# Patient Record
Sex: Female | Born: 1980 | Race: White | Hispanic: No | Smoking: Current some day smoker
Health system: Southern US, Community
[De-identification: ages and names within clinical notes are randomized; demographics above are authoritative.]

## PROBLEM LIST (undated history)

## (undated) DIAGNOSIS — J189 Pneumonia, unspecified organism: Secondary | ICD-10-CM

---

## 2017-07-02 ENCOUNTER — Encounter (HOSPITAL_COMMUNITY): Payer: Self-pay

## 2017-07-02 ENCOUNTER — Emergency Department (HOSPITAL_COMMUNITY): Payer: Self-pay

## 2017-07-02 ENCOUNTER — Emergency Department (HOSPITAL_COMMUNITY)
Admission: EM | Admit: 2017-07-02 | Discharge: 2017-07-02 | Disposition: A | Discharge status: Home / Self Care | Payer: Self-pay | Attending: Emergency Medicine | Admitting: Emergency Medicine

## 2017-07-02 ENCOUNTER — Other Ambulatory Visit: Payer: Self-pay

## 2017-07-02 DIAGNOSIS — X500XXA Overexertion from strenuous movement or load, initial encounter: Secondary | ICD-10-CM | POA: Insufficient documentation

## 2017-07-02 DIAGNOSIS — Z23 Encounter for immunization: Secondary | ICD-10-CM | POA: Insufficient documentation

## 2017-07-02 DIAGNOSIS — Y999 Unspecified external cause status: Secondary | ICD-10-CM | POA: Insufficient documentation

## 2017-07-02 DIAGNOSIS — Y9301 Activity, walking, marching and hiking: Secondary | ICD-10-CM | POA: Insufficient documentation

## 2017-07-02 DIAGNOSIS — Y929 Unspecified place or not applicable: Secondary | ICD-10-CM | POA: Insufficient documentation

## 2017-07-02 DIAGNOSIS — S82832A Other fracture of upper and lower end of left fibula, initial encounter for closed fracture: Secondary | ICD-10-CM | POA: Insufficient documentation

## 2017-07-02 MED ORDER — TETANUS-DIPHTH-ACELL PERTUSSIS 5-2.5-18.5 LF-MCG/0.5 IM SUSP
0.5000 mL | Freq: Once | INTRAMUSCULAR | Status: AC
  Administered 2017-07-02: 0.5 mL via INTRAMUSCULAR
  Filled 2017-07-02: qty 0.5

## 2017-07-02 MED ORDER — OXYCODONE-ACETAMINOPHEN 5-325 MG PO TABS: 1.0000 | ORAL_TABLET | Freq: Three times a day (TID) | ORAL | 0 refills | Status: DC | PRN

## 2017-07-02 MED ORDER — OXYCODONE-ACETAMINOPHEN 5-325 MG PO TABS
1.0000 | ORAL_TABLET | Freq: Once | ORAL | Status: AC
  Administered 2017-07-02: 1 via ORAL
  Filled 2017-07-02: qty 1

## 2017-07-02 NOTE — ED Triage Notes (Signed)
Pt reports that she rolled her foot  And fell while walking her dog today around 2pm. Pt reports 19/10 ankle and shin pain to left leg. Pt has swelling noted to left ankle and + PMS

## 2017-07-02 NOTE — Discharge Instructions (Addendum)
Please do not bear weight, use crutches to move about.  Call and follow up with orthopedist next week for further care.  Return if you have any concerns.

## 2017-07-02 NOTE — ED Provider Notes (Signed)
[facility]-EMERGENCY DEPT Provider Note   CSN: [id number] Arrival date & time: 07/02/17  1742     History   Chief Complaint No chief complaint on file.   HPI Connie Long is a 37 y.o. female.  HPI   37 year old female presenting for evaluation of left ankle injury.  Patient report approximately 3 hours ago she was walking her dog when she was distracted and stepped down on an uneven ledge wrong and twisted her left ankle.  She fell down the ground but denies hitting her head or loss of consciousness.  She report acute onset of sharp pain, nonradiating, involving her left ankle with pain mostly to the lateral aspect of her ankle.  She was unable to bear weight afterward.  No pain to her knee, hip, or foot.  She suffered a scrape, does not recall last tetanus status.  No numbness or weakness.  No precipitating symptoms prior to the fall.  No past medical history on file.  There are no active problems to display for this patient.   The histories are not reviewed yet. Please review them in the "History" navigator section and refresh this SmartLink.   OB History   None      Home Medications    Prior to Admission medications   Not on File    Family History No family history on file.  Social History Social History   Tobacco Use  . Smoking status: Not on file  Substance Use Topics  . Alcohol use: Not on file  . Drug use: Not on file     Allergies   Patient has no allergy information on record.   Review of Systems Review of Systems  Constitutional: Negative for fever.  Musculoskeletal: Positive for joint swelling.  Skin: Positive for wound.  Neurological: Negative for numbness.     Physical Exam Updated Vital Signs BP (!) 140/102 (BP Location: Right Arm)   Pulse 83   Temp 98.6 F (37 C) (Oral)   Resp 18   Ht  (1.626 m)   Wt 90.7 kg (200 lb)   SpO2 96%   BMI 34.33 kg/m   Physical Exam  Constitutional: She appears  well-developed and well-nourished. No distress.  HENT:  Head: Atraumatic.  Eyes: Conjunctivae are normal.  Neck: Neck supple.  Musculoskeletal: She exhibits tenderness (Left ankle: Tenderness and swelling noted to lateral malleoli region on palpation without crepitus or obvious deformity.  Small abrasion noted to the medial malleoli region without skin tenting.  Decreased dorsiflexion, plantarflexion, inversion eversion ).  No pain to left knee, no pain to left foot specifically at the fifth metatarsal region.  Neurological: She is alert.  Skin: No rash noted.  Psychiatric: She has a normal mood and affect.  Nursing note and vitals reviewed.    ED Treatments / Results  Labs (all labs ordered are listed, but only abnormal results are displayed) Labs Reviewed - No data to display  EKG None  Radiology Dg Ankle Complete Left  Result Date: 07/02/2017 CLINICAL DATA:  Twisting injury to the ankle EXAM: LEFT ANKLE COMPLETE - 3+ VIEW COMPARISON:  None. FINDINGS: Acute fracture distal shaft of the fibula with about 1/4 bone with of lateral and posterior displacement of distal fracture fragment. Ankle mortise is grossly symmetric. Soft tissue swelling is present laterally. IMPRESSION: Acute mildly displaced distal fibular fracture Electronically Signed   By: Jasmine Pang M.D.   On: 07/02/2017 18:50    Procedures Procedures (including critical care  time)  SPLINT APPLICATION Date/Time: 7:26 PM Authorized by: Fayrene Helper Consent: Verbal consent obtained. Risks and benefits: risks, benefits and alternatives were discussed Consent given by: patient Splint applied by: orthopedic technician Location details: L ankle Splint type: stirrup Supplies used: orthoGlass Post-procedure: The splinted body part was neurovascularly unchanged following the procedure. Patient tolerance: Patient tolerated the procedure well with no immediate complications.     Medications Ordered in ED Medications    oxyCODONE-acetaminophen (PERCOCET/ROXICET) 5-325 MG per tablet 1 tablet (has no administration in time range)  Tdap (BOOSTRIX) injection 0.5 mL (has no administration in time range)     Initial Impression / Assessment and Plan / ED Course  I have reviewed the triage vital signs and the nursing notes.  Pertinent labs & imaging results that were available during my care of the patient were reviewed by me and considered in my medical decision making (see chart for details).     BP (!) 140/102 (BP Location: Right Arm)   Pulse 83   Temp 98.6 F (37 C) (Oral)   Resp 18   Ht  (1.626 m)   Wt 90.7 kg (200 lb)   LMP 07/02/2017   SpO2 96%   BMI 34.33 kg/m    Final Clinical Impressions(s) / ED Diagnoses   Final diagnoses:  Closed fracture of distal end of left fibula, unspecified fracture morphology, initial encounter    ED Discharge Orders        Ordered    oxyCODONE-acetaminophen (PERCOCET) 5-325 MG tablet  Every 8 hours PRN     07/02/17 1923     6:23 PM Patient here with a mechanical injury injuring her left ankle.  Ankle is swollen and tender to the lateral malleoli.  X-ray ordered, she does have a scrape on the medial aspect of her ankle not consistent with an open fracture.  Will update tetanus.  Pain medication given.  She is neurovascular intact.  7:20 PM Xray demonstrates mildly displaced L distal fibular fx. This is a closed injury.  Will placed ankle in a posterior/stirrup splint, provide crutches, non weight bearing and outpt f/u with orthopedist for further care.  In order to decrease risk of narcotic abuse. Pt's record were checked using the [place] Controlled Substance database.     Fayrene Helper, PA-C 07/02/17 1944    Jacalyn Lefevre, MD 07/02/17 1945

## 2017-07-02 NOTE — ED Notes (Signed)
Gave Pt ice pack for Lt ankle.

## 2017-07-07 ENCOUNTER — Telehealth (INDEPENDENT_AMBULATORY_CARE_PROVIDER_SITE_OTHER): Payer: Self-pay | Admitting: Orthopedic Surgery

## 2017-07-07 NOTE — Telephone Encounter (Signed)
IC patient. Worked her in for tomorrow.

## 2017-07-07 NOTE — Telephone Encounter (Signed)
Patient has not been seen yet here before, but was seen at the ER for a fractured ankle. I made her an appointment for Monday at 3 with Dr. August Saucer and she would like to ask you a few questions about her pain level and when she might be able to possibly return to work. CB # [phone]

## 2017-07-08 ENCOUNTER — Encounter (INDEPENDENT_AMBULATORY_CARE_PROVIDER_SITE_OTHER): Payer: Self-pay | Admitting: Radiology

## 2017-07-08 ENCOUNTER — Encounter (INDEPENDENT_AMBULATORY_CARE_PROVIDER_SITE_OTHER): Payer: Self-pay | Admitting: Orthopedic Surgery

## 2017-07-08 ENCOUNTER — Ambulatory Visit (INDEPENDENT_AMBULATORY_CARE_PROVIDER_SITE_OTHER): Payer: 59 | Admitting: Orthopedic Surgery

## 2017-07-08 ENCOUNTER — Ambulatory Visit (INDEPENDENT_AMBULATORY_CARE_PROVIDER_SITE_OTHER): Payer: 59

## 2017-07-08 DIAGNOSIS — M25572 Pain in left ankle and joints of left foot: Secondary | ICD-10-CM | POA: Diagnosis not present

## 2017-07-08 DIAGNOSIS — S8262XA Displaced fracture of lateral malleolus of left fibula, initial encounter for closed fracture: Secondary | ICD-10-CM | POA: Diagnosis not present

## 2017-07-08 MED ORDER — OXYCODONE-ACETAMINOPHEN 5-325 MG PO TABS: 1.0000 | ORAL_TABLET | Freq: Four times a day (QID) | ORAL | 0 refills | Status: DC | PRN

## 2017-07-08 NOTE — Progress Notes (Signed)
Office Visit Note   Patient: Connie Long           Date of Birth: [birth year]           MRN: [id number] Visit Date: 07/08/2017 Requested by: No referring provider defined for this encounter. PCP: Patient, No Pcp Per  Subjective: Chief Complaint  Patient presents with  . Left Ankle - Injury, Fracture    HPI: Connie Long is a patient with left ankle injury.  She twisted her ankle while walking her dog 6 days ago.  Went to [facility].  Radiographs obtained and she was placed in a splint.  No family history or personal history of DVT.  She is an occasional smoker.  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  She used to be in the [employer].  Her knees give way sometimes which is caused her to have this ankle injury.  She was given 20 oxycodone in the emergency room.  They are almost out.              ROS: All systems reviewed are negative as they relate to the chief complaint within the history of present illness.  Patient denies  fevers or chills.   Assessment & Plan: Visit Diagnoses:  1. Pain in left ankle and joints of left foot   2. Closed displaced fracture of lateral malleolus of left fibula, initial encounter     Plan: Impression is left ankle lateral malleolus fracture.  Plan is left ankle casting.  Repeat radiographs today demonstrate no change in alignment.  He is to come back in a week for repeat clinical check.  Radiographs at that time in the cast.  Begin aspirin 81 mg twice a day.  Doctor's note to be out of work last 7 days and next 7 days.  Oxycodone refilled.  Follow-Up Instructions: Return in about 1 week (around 07/15/2017).   Orders:  Orders Placed This Encounter  Procedures  . XR Ankle Complete Left   Meds ordered this encounter  Medications  . oxyCODONE-acetaminophen (PERCOCET) 5-325 MG tablet    Sig: Take 1 tablet by mouth every 6 (six) hours as needed for moderate pain or severe pain.    Dispense:  28 tablet    Refill:  0      Procedures: No procedures performed   Clinical Data: No additional findings.  Objective: Vital Signs: LMP 07/02/2017   Physical Exam:   Constitutional: Patient appears well-developed HEENT:  Head: Normocephalic Eyes:EOM are normal Neck: Normal range of motion Cardiovascular: Normal rate Pulmonary/chest: Effort normal Neurologic: Patient is alert Skin: Skin is warm Psychiatric: Patient has normal mood and affect    Ortho Exam: Orthopedic exam demonstrates no medial sided tenderness to the left ankle.  Sensation intact on the dorsal and plantar aspect of the foot.  No calf tenderness is present on the left-hand side.  There is some swelling and tenderness laterally as expected.  Foot is perfused.  Specialty Comments:  No specialty comments available.  Imaging: Xr Ankle Complete Left  Result Date: 07/08/2017 AP lateral mortise left ankle reviewed.  Again noted is lateral malleolar fracture with no change in alignment compared to radiographs from 6 days ago.  There is no shortening.  There is about 2 to 3 mm of lateral displacement.  Mortise symmetric.  No medial sided bony pathology present.    PMFS History: There are no active problems to display for this patient.  History reviewed. No pertinent past medical  history.  History reviewed. No pertinent family history.  History reviewed. No pertinent surgical history. Social History   Occupational History  . Not on file  Tobacco Use  . Smoking status: Current Some Day Smoker  . Smokeless tobacco: Never Used  Substance and Sexual Activity  . Alcohol use: Yes    Alcohol/week: 2.4 oz    Types: 4 Standard drinks or equivalent per week  . Drug use: Not Currently  . Sexual activity: Yes

## 2017-07-13 ENCOUNTER — Ambulatory Visit (INDEPENDENT_AMBULATORY_CARE_PROVIDER_SITE_OTHER): Payer: Self-pay | Admitting: Orthopedic Surgery

## 2017-07-15 ENCOUNTER — Ambulatory Visit (INDEPENDENT_AMBULATORY_CARE_PROVIDER_SITE_OTHER): Payer: 59 | Admitting: Orthopedic Surgery

## 2017-07-15 ENCOUNTER — Ambulatory Visit (INDEPENDENT_AMBULATORY_CARE_PROVIDER_SITE_OTHER): Payer: 59

## 2017-07-15 ENCOUNTER — Encounter (INDEPENDENT_AMBULATORY_CARE_PROVIDER_SITE_OTHER): Payer: Self-pay | Admitting: Orthopedic Surgery

## 2017-07-15 DIAGNOSIS — S8262XA Displaced fracture of lateral malleolus of left fibula, initial encounter for closed fracture: Secondary | ICD-10-CM

## 2017-07-15 NOTE — Progress Notes (Signed)
   Post-Op Visit Note   Patient: Connie Long           Date of Birth: 05/12/1980           MRN: [id number] Visit Date: 07/15/2017 PCP: Patient, No Pcp Per   Assessment & Plan:  Chief Complaint:  Chief Complaint  Patient presents with  . Left Ankle - Follow-up, Fracture   Visit Diagnoses:  1. Closed displaced fracture of lateral malleolus of left fibula, initial encounter     Plan: Connie Long is a 37 year old patient with left ankle fracture.  She is about 10 days out.  On exam the cast is well fitting.  Toe movement is intact.  Her pain has diminished.  Radiographs show no change in fracture alignment.  Plan is to continue aspirin for DVT prophylaxis.  Come back in 2 weeks for repeat radiographs out of the cast.  May change over to a fracture boot at that time if callus formation is present.  Follow-Up Instructions: Return in about 2 weeks (around 07/29/2017).   Orders:  Orders Placed This Encounter  Procedures  . XR Ankle Complete Left   No orders of the defined types were placed in this encounter.   Imaging: Xr Ankle Complete Left  Result Date: 07/15/2017 AP lateral mortise left ankle reviewed.  No significant change in alignment of the lateral malleolus fracture is noted.  Mortise is symmetric.  There is no significant shortening of the lateral malleolus compared to the medial malleolus   PMFS History: There are no active problems to display for this patient.  History reviewed. No pertinent past medical history.  History reviewed. No pertinent family history.  History reviewed. No pertinent surgical history. Social History   Occupational History  . Not on file  Tobacco Use  . Smoking status: Current Some Day Smoker  . Smokeless tobacco: Never Used  Substance and Sexual Activity  . Alcohol use: Yes    Alcohol/week: 2.4 oz    Types: 4 Standard drinks or equivalent per week  . Drug use: Not Currently  . Sexual activity: Yes

## 2017-07-20 ENCOUNTER — Telehealth (INDEPENDENT_AMBULATORY_CARE_PROVIDER_SITE_OTHER): Payer: Self-pay | Admitting: Orthopedic Surgery

## 2017-07-20 MED ORDER — OXYCODONE-ACETAMINOPHEN 5-325 MG PO TABS: ORAL_TABLET | ORAL | 0 refills | Status: DC

## 2017-07-20 NOTE — Telephone Encounter (Signed)
Please advise. Thanks.  

## 2017-07-20 NOTE — Telephone Encounter (Signed)
IC, no answer. LMVM advised could pick up rx at front desk to take to pharmacy

## 2017-07-20 NOTE — Telephone Encounter (Signed)
Patient has 6 tablets of her p

## 2017-07-20 NOTE — Telephone Encounter (Signed)
Ok for 10 more 1 po q d

## 2017-07-20 NOTE — Telephone Encounter (Signed)
Patient has 6 tablets of her oxycodone left, and has 9 days until she sees Dr. August Saucerean. She said she is only having pain at night and is wondering if she can get a couple more tablets after these run out. Or if she needs to take aspirin or another pain med. You can advise her at her work today  # [phone] or cell # [phone]

## 2017-07-29 ENCOUNTER — Ambulatory Visit (INDEPENDENT_AMBULATORY_CARE_PROVIDER_SITE_OTHER): Payer: 59

## 2017-07-29 ENCOUNTER — Ambulatory Visit (INDEPENDENT_AMBULATORY_CARE_PROVIDER_SITE_OTHER): Payer: 59 | Admitting: Orthopedic Surgery

## 2017-07-29 ENCOUNTER — Encounter (INDEPENDENT_AMBULATORY_CARE_PROVIDER_SITE_OTHER): Payer: Self-pay | Admitting: Orthopedic Surgery

## 2017-07-29 DIAGNOSIS — S8262XA Displaced fracture of lateral malleolus of left fibula, initial encounter for closed fracture: Secondary | ICD-10-CM

## 2017-07-29 MED ORDER — HYDROCODONE-ACETAMINOPHEN 5-325 MG PO TABS: ORAL_TABLET | ORAL | 0 refills | Status: DC

## 2017-07-29 NOTE — Progress Notes (Signed)
   Post-Op Visit Note   Patient: Connie Long           Date of Birth: 12/20/1980           MRN: [id number] Visit Date: 07/29/2017 PCP: Patient, No Pcp Per   Assessment & Plan:  Chief Complaint:  Chief Complaint  Patient presents with  . Left Ankle - Follow-up, Fracture   Visit Diagnoses:  1. Closed displaced fracture of lateral malleolus of left fibula, initial encounter     Plan: Connie Long is a patient with left ankle fracture.  She is on aspirin.  Cast removed today.  Lateral malleolus mildly tender.  No calf tenderness to palpation and negative Homans sign.  Radiographs show no change in fracture alignment with some early callus formation noted proximally.  Plan is fracture boot with nonweightbearing for a week but range of motion to begin.  Partial weightbearing which is demonstrated for the second week and then weightbearing as tolerated the third week.  I will see her back in 3 weeks for repeat radiographs.  Out of work today.  Follow-Up Instructions: Return in about 3 weeks (around 08/19/2017).   Orders:  Orders Placed This Encounter  Procedures  . XR Ankle Complete Left   Meds ordered this encounter  Medications  . HYDROcodone-acetaminophen (NORCO/VICODIN) 5-325 MG tablet    Sig: 1 po q d prn pain    Dispense:  15 tablet    Refill:  0    Imaging: Xr Ankle Complete Left  Result Date: 07/29/2017 AP lateral mortise left ankle reviewed.  No significant change in fracture alignment present.  Small amount of callus noted at the superior aspect of the spiked fragment of the lateral malleolus fracture   PMFS History: There are no active problems to display for this patient.  History reviewed. No pertinent past medical history.  History reviewed. No pertinent family history.  History reviewed. No pertinent surgical history. Social History   Occupational History  . Not on file  Tobacco Use  . Smoking status: Current Some Day Smoker  . Smokeless tobacco: Never  Used  Substance and Sexual Activity  . Alcohol use: Yes    Alcohol/week: 2.4 oz    Types: 4 Standard drinks or equivalent per week  . Drug use: Not Currently  . Sexual activity: Yes

## 2017-08-06 ENCOUNTER — Telehealth (INDEPENDENT_AMBULATORY_CARE_PROVIDER_SITE_OTHER): Payer: Self-pay

## 2017-08-06 NOTE — Telephone Encounter (Signed)
Pt in boot. Yesterday was 1st day she put her toe down. Having a lot of pain since. Taking pain med which is helping but had to call out of work today due to this, pain moving up leg to knee. "feels like muscular pain not bone pain." Has been keeping it elevated as much as possible. Wants to know if this is normal? Any stretches she should or could be doing?

## 2017-08-06 NOTE — Telephone Encounter (Signed)
Please review and advise. DOI 07/02/17-left ankle fracture Has been in fx boot but today started with toe touch weightbearing.

## 2017-08-07 NOTE — Telephone Encounter (Signed)
Is normal to have some pain when he start weightbearing.  Always okay to come back in next week for repeat clinical check if needed.  Continue taking aspirin daily.

## 2017-08-07 NOTE — Telephone Encounter (Signed)
LMOM of the below message from [employer]

## 2017-08-19 ENCOUNTER — Ambulatory Visit (INDEPENDENT_AMBULATORY_CARE_PROVIDER_SITE_OTHER): Payer: 59 | Admitting: Orthopedic Surgery

## 2017-08-19 ENCOUNTER — Encounter (INDEPENDENT_AMBULATORY_CARE_PROVIDER_SITE_OTHER): Payer: Self-pay | Admitting: Orthopedic Surgery

## 2017-08-19 ENCOUNTER — Ambulatory Visit (INDEPENDENT_AMBULATORY_CARE_PROVIDER_SITE_OTHER): Payer: 59

## 2017-08-19 DIAGNOSIS — S8262XA Displaced fracture of lateral malleolus of left fibula, initial encounter for closed fracture: Secondary | ICD-10-CM | POA: Diagnosis not present

## 2017-08-19 NOTE — Progress Notes (Signed)
   Post-Op Visit Note   Patient: Connie Long           Date of Birth: 08/11/1980           MRN: [id number] Visit Date: 08/19/2017 PCP: Patient, No Pcp Per   Assessment & Plan:  Chief Complaint:  Chief Complaint  Patient presents with  . Left Ankle - Follow-up    07/02/17 left lateral malleolus fracture   Visit Diagnoses:  1. Closed displaced fracture of lateral malleolus of left fibula, initial encounter     Plan: Connie Long is a patient who is now 6 weeks out lateral malleolar fracture on the left.  She is been doing well.  On exam still some swelling but no calf tenderness.  Range of motion is improving.  Radiographs look good.  Plan is to weight-bear as tolerated in the fracture boot for the next 5 days.  Therapy for gait training start next week.  Out of work yesterday and today decision she can have a chance to start working on getting the ankle moving.  6-week return for clinical recheck.  Probably not get x-rays at that time unless she is not improving clinically with her pain.  Follow-Up Instructions: Return in about 6 weeks (around 09/30/2017).   Orders:  Orders Placed This Encounter  Procedures  . XR Ankle Complete Left   No orders of the defined types were placed in this encounter.   Imaging: Xr Ankle Complete Left  Result Date: 08/19/2017 AP lateral mortise left ankle reviewed.  Lateral malleolar fracture is in good position and alignment with evidence of callus formation at the proximal aspect.  Mortise remains symmetric.   PMFS History: There are no active problems to display for this patient.  History reviewed. No pertinent past medical history.  History reviewed. No pertinent family history.  History reviewed. No pertinent surgical history. Social History   Occupational History  . Not on file  Tobacco Use  . Smoking status: Current Some Day Smoker  . Smokeless tobacco: Never Used  Substance and Sexual Activity  . Alcohol use: Yes    Alcohol/week:  2.4 oz    Types: 4 Standard drinks or equivalent per week  . Drug use: Not Currently  . Sexual activity: Yes

## 2017-08-19 NOTE — Addendum Note (Signed)
Addended byPrescott Parma: Crystal Scarberry on: 08/19/2017 04:51 PM   Modules accepted: Orders

## 2017-09-01 ENCOUNTER — Telehealth (INDEPENDENT_AMBULATORY_CARE_PROVIDER_SITE_OTHER): Payer: Self-pay

## 2017-09-01 NOTE — Telephone Encounter (Signed)
Faxed pts ins cards and demographic info per her request. Pt has appt there this afternoon.

## 2017-09-30 ENCOUNTER — Ambulatory Visit (INDEPENDENT_AMBULATORY_CARE_PROVIDER_SITE_OTHER): Payer: 59 | Admitting: Orthopedic Surgery

## 2017-10-16 ENCOUNTER — Encounter (INDEPENDENT_AMBULATORY_CARE_PROVIDER_SITE_OTHER): Payer: Self-pay | Admitting: Orthopedic Surgery

## 2017-10-16 ENCOUNTER — Ambulatory Visit (INDEPENDENT_AMBULATORY_CARE_PROVIDER_SITE_OTHER): Payer: 59 | Admitting: Orthopedic Surgery

## 2017-10-16 ENCOUNTER — Ambulatory Visit (INDEPENDENT_AMBULATORY_CARE_PROVIDER_SITE_OTHER): Payer: 59

## 2017-10-16 DIAGNOSIS — S8262XA Displaced fracture of lateral malleolus of left fibula, initial encounter for closed fracture: Secondary | ICD-10-CM

## 2017-10-16 NOTE — Progress Notes (Signed)
   Post-Op Visit Note   Patient: Connie Long           Date of Birth: Dec 16, 1980           MRN: [id number] Visit Date: 10/16/2017 PCP: Patient, No Pcp Per   Assessment & Plan:  Chief Complaint:  Chief Complaint  Patient presents with  . Left Ankle - Fracture   Visit Diagnoses:  1. Closed displaced fracture of lateral malleolus of left fibula, initial encounter     Plan: Patient presents for follow-up of left ankle fracture.  Date of injury 07/02/2017.  She states she still has occasional pain but it is improving.  Physical therapy is done.  Hurts her with pushoff but the pain is minimal.  She is wearing regular shoes.  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  She does have a history of low vitamin D and she is requested refill of vitamin D from the [place].  On examination she has pretty normal gait alignment.  Only mild tenderness over the fracture site.  Mortise is symmetric on examination with lateral stress.  Ankle range of motion is improving as well and is close to normal with dorsiflexion and plantarflexion.  Plan at this time is for her to take vitamin D to accelerate the robustness of her callus response and healing in that left ankle.  No evidence of instability at this time but there is a little bit of delayed healing which I think should be corrected when she starts taking vitamin D.  Follow-up with me as needed.  Anticipate that she should be able to start running after December if she gets back on her vitamin D.  Follow-Up Instructions: No follow-ups on file.   Orders:  Orders Placed This Encounter  Procedures  . XR Ankle Complete Left   No orders of the defined types were placed in this encounter.   Imaging: Xr Ankle Complete Left  Result Date: 10/16/2017 AP lateral mortise left ankle reviewed.  Left fibula fracture visualized.  Mortise symmetric.  Some callus formation is present distally and posteriorly.  Also some proximally.  Callus formation is not  robust but it is present.   PMFS History: There are no active problems to display for this patient.  History reviewed. No pertinent past medical history.  History reviewed. No pertinent family history.  History reviewed. No pertinent surgical history. Social History   Occupational History  . Not on file  Tobacco Use  . Smoking status: Current Some Day Smoker  . Smokeless tobacco: Never Used  Substance and Sexual Activity  . Alcohol use: Yes    Alcohol/week: 4.0 standard drinks    Types: 4 Standard drinks or equivalent per week  . Drug use: Not Currently  . Sexual activity: Yes

## 2018-11-01 ENCOUNTER — Other Ambulatory Visit: Payer: Self-pay | Admitting: *Deleted

## 2018-11-01 ENCOUNTER — Telehealth: Payer: Self-pay | Admitting: *Deleted

## 2018-11-01 DIAGNOSIS — Z20822 Contact with and (suspected) exposure to covid-19: Secondary | ICD-10-CM

## 2018-11-01 NOTE — Telephone Encounter (Signed)
Pt reports N/V/D x 1 week. Fever last Wednesday, increased fatigue. Requesting information on covid testing. Info provided. Will go to [place] site. Instructed to quarantine until resulted.

## 2018-11-02 LAB — NOVEL CORONAVIRUS, NAA: SARS-CoV-2, NAA: NOT DETECTED

## 2019-09-01 ENCOUNTER — Ambulatory Visit (HOSPITAL_COMMUNITY)
Admission: EM | Admit: 2019-09-01 | Discharge: 2019-09-01 | Disposition: A | Discharge status: Home / Self Care | Payer: Non-veteran care

## 2019-09-01 ENCOUNTER — Encounter (HOSPITAL_COMMUNITY): Payer: Self-pay

## 2019-09-01 ENCOUNTER — Other Ambulatory Visit: Payer: Self-pay

## 2019-09-01 ENCOUNTER — Ambulatory Visit (INDEPENDENT_AMBULATORY_CARE_PROVIDER_SITE_OTHER): Payer: Non-veteran care

## 2019-09-01 DIAGNOSIS — M25421 Effusion, right elbow: Secondary | ICD-10-CM

## 2019-09-01 DIAGNOSIS — S59901D Unspecified injury of right elbow, subsequent encounter: Secondary | ICD-10-CM

## 2019-09-01 MED ORDER — MELOXICAM 7.5 MG PO TABS: 7.5000 mg | ORAL_TABLET | Freq: Every day | ORAL | 0 refills | Status: AC

## 2019-09-01 NOTE — Discharge Instructions (Signed)
Your x-ray did not show any fractures. I believe you have torn a tendon or muscle. I would recommend following up with orthopedic specialist I can send you to sports medicine and they can do a ultrasound of the arm to take a better look. We are too far out for compartment syndrome so not likely.

## 2019-09-01 NOTE — ED Triage Notes (Signed)
Pt c/o right elbow pain > 1 month, fell on 6/11, was seen at [facility] on 6/12 and was told no fracture. Pt here for continued pain and swelling

## 2019-09-01 NOTE — ED Provider Notes (Signed)
[facility]    CSN: [id number] Arrival date & time: 09/01/19  1015      History   Chief Complaint Chief Complaint  Patient presents with  . Elbow Pain    HPI Connie Long is a 39 y.o. female.   Patient is a otherwise healthy 39 year old female presents today with continued elbow pain.  She previously injured the elbow on 6/11.  Reporting that she fell landing directly on the right arm and right elbow.  Instant swelling, pain and limited range of motion.  [facility] and had an x-ray which they reported to be negative for fracture.  She has been putting diclofenac gel on the arm, resting, icing and wearing a elbow splint.  Feels like the symptoms are worsening with worsening pain and swelling at this point.  Limited range of motion.  She has some associated numbness, tingling and radiation of pain down the forearm.  ROS per HPI      History reviewed. No pertinent past medical history.  There are no problems to display for this patient.   History reviewed. No pertinent surgical history.  OB History   No obstetric history on file.      Home Medications    Prior to Admission medications   Medication Sig Start Date End Date Taking? Authorizing Provider  cetirizine (ZYRTEC) 10 MG tablet Take 10 mg by mouth daily.   Yes [provider]  naproxen (NAPROSYN) 250 MG tablet Take by mouth 2 (two) times daily with a meal.   Yes [provider]  sertraline (ZOLOFT) 50 MG tablet Take 50 mg by mouth daily.   Yes [provider]  cyclobenzaprine (FLEXERIL) 10 MG tablet Take 10 mg by mouth at bedtime as needed. 07/28/19   [provider]  meloxicam (MOBIC) 7.5 MG tablet Take 1 tablet (7.5 mg total) by mouth daily for 14 days. 09/01/19 09/15/19  Janace Aris, NP    Family History Family History  Problem Relation Age of Onset  . Healthy Mother   . Healthy Father     Social History Social History    Tobacco Use  . Smoking status: Current Some Day Smoker  . Smokeless tobacco: Never Used  Substance Use Topics  . Alcohol use: Yes    Alcohol/week: 4.0 standard drinks    Types: 4 Standard drinks or equivalent per week  . Drug use: Not Currently     Allergies   Patient has no known allergies.   Review of Systems Review of Systems   Physical Exam Triage Vital Signs ED Triage Vitals [09/01/19 1152]  Enc Vitals Group     BP (!) 145/87     Pulse Rate 88     Resp 18     Temp 98.8 F (37.1 C)     Temp src      SpO2 98 %     Weight      Height      Head Circumference      Peak Flow      Pain Score      Pain Loc      Pain Edu?      Excl. in GC?    No data found.  Updated Vital Signs BP (!) 145/87   Pulse 88   Temp 98.8 F (37.1 C)   Resp 18   LMP 08/02/2019   SpO2 98%   Visual Acuity Right Eye Distance:   Left  Eye Distance:   Bilateral Distance:    Right Eye Near:   Left Eye Near:    Bilateral Near:     Physical Exam Vitals and nursing note reviewed.  Constitutional:      General: She is not in acute distress.    Appearance: Normal appearance. She is not ill-appearing, toxic-appearing or diaphoretic.  HENT:     Head: Normocephalic.     Nose: Nose normal.  Eyes:     Conjunctiva/sclera: Conjunctivae normal.  Pulmonary:     Effort: Pulmonary effort is normal.  Musculoskeletal:     Right elbow: Swelling and deformity present. Decreased range of motion. Tenderness present.       Arms:     Cervical back: Normal range of motion.     Comments: Tenderness, swelling, bruising and mild deformity just above the lateral epicondyle Limited flexion extension of the elbow.  Skin:    General: Skin is warm and dry.     Findings: No rash.  Neurological:     Mental Status: She is alert.  Psychiatric:        Mood and Affect: Mood normal.      UC Treatments / Results  Labs (all labs ordered are listed, but only abnormal results are displayed) Labs  Reviewed - No data to display  EKG   Radiology DG Elbow Complete Right  Result Date: 09/01/2019 CLINICAL DATA:  Swelling.  Fall 1 month ago. EXAM: RIGHT ELBOW - COMPLETE 3+ VIEW COMPARISON:  No recent prior. FINDINGS: No acute bony or joint abnormality identified. No evidence of fracture or dislocation. IMPRESSION: No acute abnormality identified. Electronically Signed   By: Maisie Fus  Register   On: 09/01/2019 13:39    Procedures Procedures (including critical care time)  Medications Ordered in UC Medications - No data to display  Initial Impression / Assessment and Plan / UC Course  I have reviewed the triage vital signs and the nursing notes.  Pertinent labs & imaging results that were available during my care of the patient were reviewed by me and considered in my medical decision making (see chart for details).     Elbow injury X-ray without any acute fracture. This is the second x-ray with no fracture. Patient with continued pain, swelling and limited range of motion with mild deformity to the arm. There is some concern for torn tendon or muscle Meloxicam for pain Rest, ice and continue using the elbow brace We will have her follow-up with orthopedics for further management Final Clinical Impressions(s) / UC Diagnoses   Final diagnoses:  Injury of right elbow, subsequent encounter     Discharge Instructions     Your x-ray did not show any fractures. I believe you have torn a tendon or muscle. I would recommend following up with orthopedic specialist I can send you to sports medicine and they can do a ultrasound of the arm to take a better look. We are too far out for compartment syndrome so not likely.     ED Prescriptions    Medication Sig Dispense Auth. Provider   meloxicam (MOBIC) 7.5 MG tablet Take 1 tablet (7.5 mg total) by mouth daily for 14 days. 14 tablet Nakul Avino A, NP     PDMP not reviewed this encounter.   Janace Aris, NP 09/01/19 1411

## 2019-09-08 ENCOUNTER — Other Ambulatory Visit: Payer: Self-pay

## 2019-09-08 ENCOUNTER — Ambulatory Visit (INDEPENDENT_AMBULATORY_CARE_PROVIDER_SITE_OTHER): Payer: 59 | Admitting: Sports Medicine

## 2019-09-08 VITALS — BP 141/103 | Ht 63.0 in | Wt 200.0 lb

## 2019-09-08 DIAGNOSIS — M5412 Radiculopathy, cervical region: Secondary | ICD-10-CM | POA: Diagnosis not present

## 2019-09-08 MED ORDER — PREDNISONE 10 MG PO TABS
ORAL_TABLET | ORAL | 0 refills | Status: DC
Start: 2019-09-08 — End: 2023-05-03

## 2019-09-08 NOTE — Patient Instructions (Signed)
Your Elbow itself looks to be healing from the injury you had in June, and we suspect your continued pain is radiating from your neck.  - We've ordered X-rays of your Neck and Right Shoulder to help evaluate for any evidence of new injuries in these areas [facility] written for a steroid pack. Steroids are used to calm inflammation of nerves, and this should help with the pain traveling down your arm, as well as the numbness in the mornings.  - Physical therapy should help the recovery of your elbow, as well as the shoulder and strengthen your neck as well.  - We'd love to see you have in about a month to make sure you're feeling better, and reevaluate our plan if things haven't adequately improved.  - Don't hesitate to reach back out if you have any questions!

## 2019-09-08 NOTE — Progress Notes (Signed)
Office Visit Note   Patient: Connie Long           Date of Birth: 11-04-1980           MRN: [id number] Visit Date: 09/08/2019 Requested by: No referring provider defined for this encounter. PCP: Patient, No Pcp Per  Subjective: Chief Complaint: Right Elbow/Arm pain  HPI: Patient is a 39 year old female presenting to clinic today with concerns of right elbow, arm and neck pain.  She describes a fall approximately 6 weeks ago (07/22/2019), during which she tripped and fell directly on her right elbow, jamming her arm up into her shoulder before rolling onto her back. She states that immediately after the incident, her right elbow became swollen and bruised- prompting her to present to an urgent care where x-rays were negative for fracture.  She states the pain was so severe she could not even tolerate light touch in her arm was in 'spasm' for several days.  Ever since her injury, she states that she has had significant right elbow pain, weakness throughout the arm, and numbness in the mornings in her right hand.  She has a history of chronic upper back/neck pain for which she sees a chiropractor twice weekly.  Since the injury her severe, sharp pain has improved somewhat although she remains significantly tender around the area of the olecranon and along the triceps.  She states that this tenderness has also traveled up into her right shoulder and right neck.  She was given medications for pain and spasm from her urgent care visits, but states she is hesitant to take these as they make her feel "stoned."  She is scared she might have torn something, and is worried that her healing has been slow slow.  She tries to workout diligently in the gym and has been scared to return to her normal weightlifting activities.              ROS:   All other systems were reviewed and are negative.  Objective: Vital Signs: BP (!) 141/103   Ht 5\' 3"  (1.6 m)   Wt 200 lb (90.7 kg)   BMI 35.43 kg/m   Physical  Exam:  General:  Alert and oriented, in no acute distress. Pulm:  Breathing unlabored. Psy:  Normal mood, congruent affect. Skin: No rashes or bruising appreciated. Right arm with full range of motion with elbow flexion/extension, as well as forearm pronation/supination.  Right shoulder with full range of motion. No obvious swelling, bruising, or deformity of the right olecranon. Tenderness to palpation just proximal to the right olecranon, no palpable defects appreciated within the musculature of the triceps.  Significant tenderness palpation over the medial epicondyle, which does not travel into the flexor compartment. No increased laxity with varus/valgus stress across the elbow. Endorses pain with resisted extension of the elbow. No pain with resisted supination or pronation of the forearm. No pain with resisted flexion or extension of the wrist. Tenderness to palpation right superior trapezius musculature.  Tenderness to palpation right rhomboids.  Imaging: Limited ultrasound of the elbow performed in clinic. No obvious defects, or deformities appreciated within the triceps tendon or musculature.  No evidence of fluid accumulation around the olecranon.  No effusion.  Impression: Normal elbow ultrasound  Assessment & Plan: 39 year old female presenting to clinic today approximately 6 weeks following a fall with direct impact to the right elbow.  Patient remains with significant right elbow pain, as well as with pain in the right shoulder, right trapezius  and throughout her upper back and neck.  Ultrasound in clinic today did not reveal any intrinsic elbow injury that would cause her lingering pain.  Suspect that she may have jammed her shoulder, or aggravated her chronic upper back/neck pain with possible  cervical radiculopathy as a source of her continued right upper extremity pain, and report of morning numbness in her right hand. -We will obtain x-rays of her right shoulder, as thus far  all imaging has been of her elbow.  We will also obtain x-rays of her cervical spine. -Given suspicion for acute radiculopathy, will start on Medrol Dosepak. -Referral has been placed to physical therapy to work on elbow, shoulder, and neck strengthening exercises. -Return to clinic in 3 to 4 weeks for reevaluation. -Patient was agreeable with assessment and plan, and had no further questions or concerns at the conclusion of her appointment this morning.     Patient seen and evaluated with the sports medicine fellow.  I agree with the above plan of care.  Today's ultrasound does not show any evidence of triceps tendon pathology.  No obvious joint effusion.  Patient does have some tenderness to palpation at the olecranon but has full range of motion otherwise.  I would like to get x-rays of both her cervical spine and her right shoulder.  I suspect a lot of her discomfort is from her cervical spine so we will start a Sterapred Dosepak and she will start physical therapy.  She is instructed to stop all other medications given to her at the urgent care.  I encouraged her to continue to stay as active as possible and follow-up with me in the office again in 3 to 4 weeks for reevaluation.  We may need to consider further diagnostic imaging if symptoms are not improving.

## 2019-09-29 ENCOUNTER — Ambulatory Visit: Payer: 59 | Admitting: Sports Medicine

## 2019-11-29 ENCOUNTER — Other Ambulatory Visit: Payer: 59

## 2019-11-29 DIAGNOSIS — Z20822 Contact with and (suspected) exposure to covid-19: Secondary | ICD-10-CM

## 2019-11-30 LAB — SARS-COV-2, NAA 2 DAY TAT

## 2019-11-30 LAB — NOVEL CORONAVIRUS, NAA: SARS-CoV-2, NAA: NOT DETECTED

## 2020-07-16 ENCOUNTER — Encounter (HOSPITAL_COMMUNITY): Payer: Self-pay

## 2020-07-16 ENCOUNTER — Ambulatory Visit (INDEPENDENT_AMBULATORY_CARE_PROVIDER_SITE_OTHER): Payer: Non-veteran care

## 2020-07-16 ENCOUNTER — Ambulatory Visit (HOSPITAL_COMMUNITY)
Admission: EM | Admit: 2020-07-16 | Discharge: 2020-07-16 | Disposition: A | Discharge status: Home / Self Care | Payer: Non-veteran care

## 2020-07-16 DIAGNOSIS — M25562 Pain in left knee: Secondary | ICD-10-CM

## 2020-07-16 DIAGNOSIS — S8392XA Sprain of unspecified site of left knee, initial encounter: Secondary | ICD-10-CM | POA: Diagnosis not present

## 2020-07-16 HISTORY — DX: Pneumonia, unspecified organism: J18.9

## 2020-07-16 NOTE — Discharge Instructions (Signed)
Take the meloxicam for the next 3-5 days.    Rest as much as possible Ice for 10-15 minutes every 4-6 hours as needed for pain and swelling Compression- use an ace bandage or splint for comfort.  You can also use kinetic tape for comfort.  Elevate above your hip/heart when sitting and laying down  Follow up with sports medicine or orthopedics if symptoms do not improve in the next few days.

## 2020-07-16 NOTE — ED Provider Notes (Signed)
[facility]    CSN: [id number] Arrival date & time: 07/16/20  0840      History   Chief Complaint Chief Complaint  Patient presents with  . Knee Pain    HPI Connie Long is a 40 y.o. female.   Patient here for evaluation of left knee pain that has been getting increasingly worse over the past 3 weeks.  Reports being able to straighten the knee but movement does make pain worse.  Has tried using kinetic tape and a knee brace with minimal to no relief.  Denies any trauma, injury, or other precipitating event. Denies any fevers, chest pain, shortness of breath, N/V/D, numbness, tingling, weakness, abdominal pain, or headaches.    The history is provided by the patient.    Past Medical History:  Diagnosis Date  . Pneumonia     There are no problems to display for this patient.   History reviewed. No pertinent surgical history.  OB History   No obstetric history on file.      Home Medications    Prior to Admission medications   Medication Sig Start Date End Date Taking? Authorizing Provider  buPROPion (WELLBUTRIN XL) 150 MG 24 hr tablet Take 1 tablet by mouth daily. 02/14/20  Yes [provider]  cetirizine (ZYRTEC) 10 MG tablet Take 10 mg by mouth daily.    [provider]  cyclobenzaprine (FLEXERIL) 10 MG tablet Take 10 mg by mouth at bedtime as needed. 07/28/19   [provider]  naproxen (NAPROSYN) 250 MG tablet Take by mouth 2 (two) times daily with a meal.    [provider]  predniSONE (DELTASONE) 10 MG tablet Use as directed per doctors orders. 09/08/19   Ralene Cork, DO  sertraline (ZOLOFT) 50 MG tablet Take 50 mg by mouth daily.    [provider]    Family History Family History  Problem Relation Age of Onset  . Healthy Mother   . Healthy Father     Social History Social History   Tobacco Use  . Smoking status: Current Some Day Smoker  . Smokeless tobacco: Never Used  Substance Use  Topics  . Alcohol use: Yes    Alcohol/week: 4.0 standard drinks    Types: 4 Standard drinks or equivalent per week  . Drug use: Not Currently     Allergies   Patient has no known allergies.   Review of Systems Review of Systems  Musculoskeletal: Positive for arthralgias and joint swelling.  All other systems reviewed and are negative.    Physical Exam Triage Vital Signs ED Triage Vitals  Enc Vitals Group     BP 07/16/20 0855 119/89     Pulse Rate 07/16/20 0855 72     Resp 07/16/20 0855 18     Temp 07/16/20 0855 98.4 F (36.9 C)     Temp Source 07/16/20 0855 Oral     SpO2 07/16/20 0855 100 %     Weight --      Height --      Head Circumference --      Peak Flow --      Pain Score 07/16/20 0854 10     Pain Loc --      Pain Edu? --      Excl. in GC? --    No data found.  Updated Vital Signs BP 119/89 (BP Location: Right Arm)   Pulse 72   Temp 98.4 F (36.9 C) (Oral)   Resp 18  LMP 07/14/2020 (Exact Date)   SpO2 100%   Visual Acuity Right Eye Distance:   Left Eye Distance:   Bilateral Distance:    Right Eye Near:   Left Eye Near:    Bilateral Near:     Physical Exam Vitals and nursing note reviewed.  Constitutional:      General: She is not in acute distress.    Appearance: Normal appearance. She is not ill-appearing, toxic-appearing or diaphoretic.  HENT:     Head: Normocephalic and atraumatic.  Eyes:     Conjunctiva/sclera: Conjunctivae normal.  Cardiovascular:     Rate and Rhythm: Normal rate.     Pulses: Normal pulses.  Pulmonary:     Effort: Pulmonary effort is normal.  Abdominal:     General: Abdomen is flat.  Musculoskeletal:     Cervical back: Normal range of motion.     Left knee: Swelling present. No bony tenderness or crepitus. Decreased range of motion (decreased ROM due to pain). Tenderness present over the MCL. Normal alignment, normal meniscus and normal patellar mobility. Normal pulse.  Skin:    General: Skin is warm and dry.   Neurological:     General: No focal deficit present.     Mental Status: She is alert and oriented to person, place, and time.  Psychiatric:        Mood and Affect: Mood normal.      UC Treatments / Results  Labs (all labs ordered are listed, but only abnormal results are displayed) Labs Reviewed - No data to display  EKG   Radiology DG Knee AP/LAT W/Sunrise Left  Result Date: 07/16/2020 CLINICAL DATA:  Left knee pain for 3 weeks.  No known injury. EXAM: LEFT KNEE 3 VIEWS COMPARISON:  None. FINDINGS: No evidence of fracture, dislocation, or joint effusion. No evidence of arthropathy or other focal bone abnormality. Soft tissues are unremarkable. IMPRESSION: Normal exam. Electronically Signed   By: Drusilla Kanner M.D.   On: 07/16/2020 09:29    Procedures Procedures (including critical care time)  Medications Ordered in UC Medications - No data to display  Initial Impression / Assessment and Plan / UC Course  I have reviewed the triage vital signs and the nursing notes.  Pertinent labs & imaging results that were available during my care of the patient were reviewed by me and considered in my medical decision making (see chart for details).    Assessment negative for red flags or concerns.  X-ray with no acute abnormalities.  Meloxicam daily for the next 3 to 5 days.  Patient reports having a prescription at home.  Encouraged rest, ice, elevation, and compression.  May use an Ace bandage or kinetic tape for comfort.  Follow-up with Ortho if symptoms do not improve in the next few days. Final Clinical Impressions(s) / UC Diagnoses   Final diagnoses:  Sprain of left knee, unspecified ligament, initial encounter  Acute pain of left knee     Discharge Instructions     Take the meloxicam for the next 3-5 days.    Rest as much as possible Ice for 10-15 minutes every 4-6 hours as needed for pain and swelling Compression- use an ace bandage or splint for comfort.  You can  also use kinetic tape for comfort.  Elevate above your hip/heart when sitting and laying down  Follow up with sports medicine or orthopedics if symptoms do not improve in the next few days.     ED Prescriptions    None  PDMP not reviewed this encounter.   Ivette Loyal, NP 07/16/20 1002

## 2020-07-16 NOTE — ED Triage Notes (Signed)
Pt in with c/o left knee pain x 3 weeks   Pt denies any recent injury  Pt has been using kinetic tape and a knee brace  Pt states she cannot lift her leg without assistance

## 2021-04-20 ENCOUNTER — Emergency Department (HOSPITAL_COMMUNITY): Payer: No Typology Code available for payment source

## 2021-04-20 ENCOUNTER — Encounter (HOSPITAL_COMMUNITY): Payer: Self-pay

## 2021-04-20 ENCOUNTER — Emergency Department (HOSPITAL_COMMUNITY)
Admission: EM | Admit: 2021-04-20 | Discharge: 2021-04-20 | Disposition: A | Discharge status: Home / Self Care | Payer: No Typology Code available for payment source | Attending: Emergency Medicine | Admitting: Emergency Medicine

## 2021-04-20 DIAGNOSIS — M25511 Pain in right shoulder: Secondary | ICD-10-CM | POA: Diagnosis present

## 2021-04-20 MED ORDER — OXYCODONE HCL 5 MG PO TABS: 2.5000 mg | ORAL_TABLET | Freq: Four times a day (QID) | ORAL | 0 refills | Status: DC | PRN

## 2021-04-20 MED ORDER — ONDANSETRON HCL 4 MG PO TABS
4.0000 mg | ORAL_TABLET | Freq: Once | ORAL | Status: AC
  Administered 2021-04-20: 4 mg via ORAL
  Filled 2021-04-20: qty 1

## 2021-04-20 MED ORDER — KETOROLAC TROMETHAMINE 60 MG/2ML IM SOLN
60.0000 mg | Freq: Once | INTRAMUSCULAR | Status: AC
  Administered 2021-04-20: 60 mg via INTRAMUSCULAR
  Filled 2021-04-20: qty 2

## 2021-04-20 MED ORDER — OXYCODONE-ACETAMINOPHEN 5-325 MG PO TABS
2.0000 | ORAL_TABLET | Freq: Once | ORAL | Status: AC
  Administered 2021-04-20: 2 via ORAL
  Filled 2021-04-20: qty 2

## 2021-04-20 MED ORDER — CELECOXIB 200 MG PO CAPS: 200.0000 mg | ORAL_CAPSULE | Freq: Two times a day (BID) | ORAL | 0 refills | Status: DC

## 2021-04-20 MED ORDER — ONDANSETRON HCL 4 MG/2ML IJ SOLN
4.0000 mg | Freq: Once | INTRAMUSCULAR | Status: DC
  Filled 2021-04-20: qty 2

## 2021-04-20 NOTE — ED Triage Notes (Signed)
Pt arrived via POV, c/o bicep tear to right arm, was seen at [facility] and scheduled for MRI Monday, states pain worsening.  ?

## 2021-04-20 NOTE — Discharge Instructions (Signed)
Contact a health care provider if: °Your pain gets worse. °Your pain is not relieved with medicines. °New pain develops in your arm, hand, or fingers. °Get help right away if: °Your arm, hand, or fingers: °Tingle. °Become numb. °Become swollen. °Become painful. °Turn white or blue. °

## 2021-04-20 NOTE — ED Provider Notes (Signed)
?[facility]-EMERGENCY DEPT ?Provider Note ? ? ?CSN: [id number] ?Arrival date & time: 04/20/21  1830 ? ?  ? ?History ? ?Chief Complaint  ?Patient presents with  ? Arm Injury  ? ? ?Connie Long is a 41 y.o. female who presents emergency department chief complaint of right shoulder pain.  Patient states that she was at her house 2 weeks ago mopping and then lifting wet laundry when she felt a pop in her right shoulder.  She had immediate severe pain in the anterior shoulder and has had difficulty utilizing the arm since that time.  She was seen at [facility] and scheduled for an outpatient MRI this coming Monday however her pain is so severe she came into the ER today for help.  She is suspected to have a biceps tendon rupture and per clinical exam from the APP at [facility].  Patient has been using oral diclofenac, topical diclofenac without any relief.  Pain wakes her from sleep.  She is unable to find a comfortable position.  She bought an over-the-counter sling for comfort.  She denies numbness or ting ? ? ?Arm Injury ? ?  ? ?Home Medications ?Prior to Admission medications   ?Medication Sig Start Date End Date Taking? Authorizing Provider  ?buPROPion (WELLBUTRIN XL) 150 MG 24 hr tablet Take 1 tablet by mouth daily. 02/14/20   [provider]  ?cetirizine (ZYRTEC) 10 MG tablet Take 10 mg by mouth daily.    [provider]  ?cyclobenzaprine (FLEXERIL) 10 MG tablet Take 10 mg by mouth at bedtime as needed. 07/28/19   [provider]  ?naproxen (NAPROSYN) 250 MG tablet Take by mouth 2 (two) times daily with a meal.    [provider]  ?predniSONE (DELTASONE) 10 MG tablet Use as directed per doctors orders. 09/08/19   Ralene Cork, DO  ?sertraline (ZOLOFT) 50 MG tablet Take 50 mg by mouth daily.    [provider]  ?   ? ?Allergies    ?Patient has no known allergies.   ? ?Review of Systems   ?Review of Systems ? ?Physical Exam ?Updated Vital  Signs ?BP (!) 154/89 (BP Location: Left Arm)   Pulse (!) 110   Temp 98.1 ?F (36.7 ?C) (Oral)   Resp 20   SpO2 100%  ?Physical Exam ?Vitals and nursing note reviewed.  ?Constitutional:   ?   General: She is not in acute distress. ?   Appearance: She is well-developed. She is not diaphoretic.  ?   Comments: Patient appears very uncomfortable  ?HENT:  ?   Head: Normocephalic and atraumatic.  ?   Right Ear: External ear normal.  ?   Left Ear: External ear normal.  ?   Nose: Nose normal.  ?   Mouth/Throat:  ?   Mouth: Mucous membranes are moist.  ?Eyes:  ?   General: No scleral icterus. ?   Conjunctiva/sclera: Conjunctivae normal.  ?Cardiovascular:  ?   Rate and Rhythm: Normal rate and regular rhythm.  ?   Heart sounds: Normal heart sounds. No murmur heard. ?  No friction rub. No gallop.  ?Pulmonary:  ?   Effort: Pulmonary effort is normal. No respiratory distress.  ?   Breath sounds: Normal breath sounds.  ?Abdominal:  ?   General: Bowel sounds are normal. There is no distension.  ?   Palpations: Abdomen is soft. There is no mass.  ?   Tenderness: There is no abdominal tenderness. There is no guarding.  ?Musculoskeletal:  ?  Cervical back: Normal range of motion.  ?   Comments: Patient examined in sling.  She is exquisitely tender to palpation over the anterior shoulder, decreased range of motion.  Normal radial pulse.  No obvious swelling or deformity  ?Skin: ?   General: Skin is warm and dry.  ?Neurological:  ?   Mental Status: She is alert and oriented to person, place, and time.  ?Psychiatric:     ?   Behavior: Behavior normal.  ? ? ?ED Results / Procedures / Treatments   ?Labs ?(all labs ordered are listed, but only abnormal results are displayed) ?Labs Reviewed - No data to display ? ?EKG ?None ? ?Radiology ?No results found. ? ?Procedures ?Procedures  ? ? ?Medications Ordered in ED ?Medications  ?ketorolac (TORADOL) injection 60 mg (has no administration in time range)  ?oxyCODONE-acetaminophen  (PERCOCET/ROXICET) 5-325 MG per tablet 2 tablet (has no administration in time range)  ?ondansetron (ZOFRAN) injection 4 mg (has no administration in time range)  ? ? ?ED Course/ Medical Decision Making/ A&P ?Clinical Course as of 04/20/21 2134  ?Sat Apr 20, 2021  ?2010 CT SHOULDER RIGHT WO CONTRAST ?I visualized images of the CT of the right shoulder which show no acute finding [AH]  ?  ?Clinical Course User Index ?[AH] Arthor Captain, PA-C  ? ?                        ?Medical Decision Making ?Patient here with severe shoulder pain.  I visualized CT imaging.  Patient had significant pain relief with oral pain medication and a shot of Toradol.  Patient feels significantly more comfortable.  Patient will be given a sling immobilizer, passive shoulder range of motion exercises, Celebrex and oxycodone IR which may use when she has severe pain that is not tolerable or is unable to sleep.  Patient also advised to use Lidoderm patch.  She will be discharged to follow-up with [facility].  Advised that she should continue to get her MRI on Monday. ?PDMP reviewed during this encounter. ? ? ?Amount and/or Complexity of Data Reviewed ?Radiology: ordered. ? ?Risk ?Prescription drug management. ? ? ? ? ? ?  ? ? ? ? ?Final Clinical Impression(s) / ED Diagnoses ?Final diagnoses:  ?None  ? ? ?Rx / DC Orders ?ED Discharge Orders   ? ? None  ? ?  ? ? ?  ?Arthor Captain, PA-C ?04/20/21 2135 ? ?  ?Gerhard Munch, MD ?04/21/21 1350 ? ?

## 2021-10-08 IMAGING — DX DG ELBOW COMPLETE 3+V*R*
4 series · 4 of 4 positions shown · non-contrast
Comparison: No recent prior.

CLINICAL DATA: Swelling.  Fall 1 month ago.

EXAM:
RIGHT ELBOW - COMPLETE 3+ VIEW

[elbow ap]
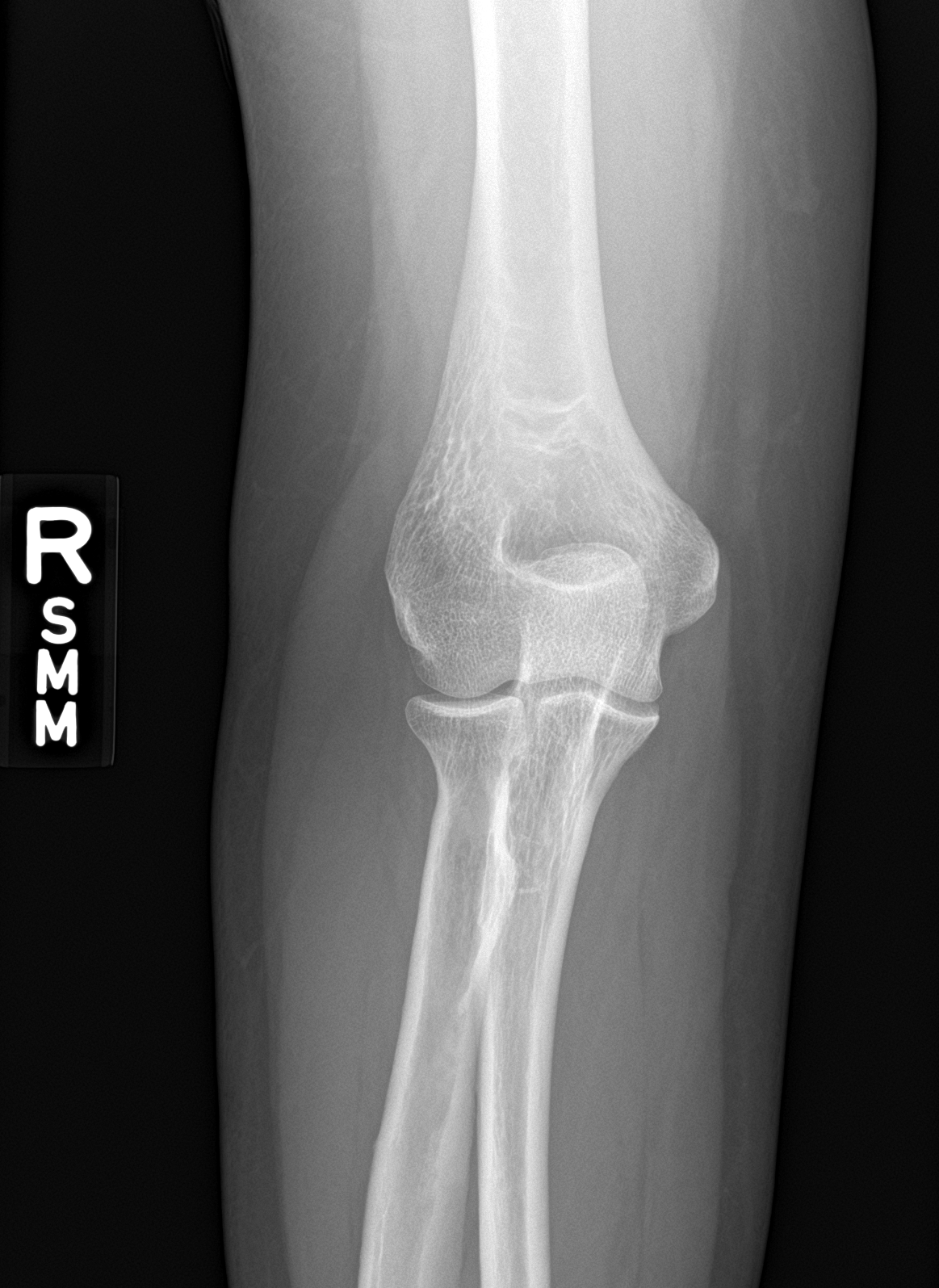

[elbow obl (1 of 2)]
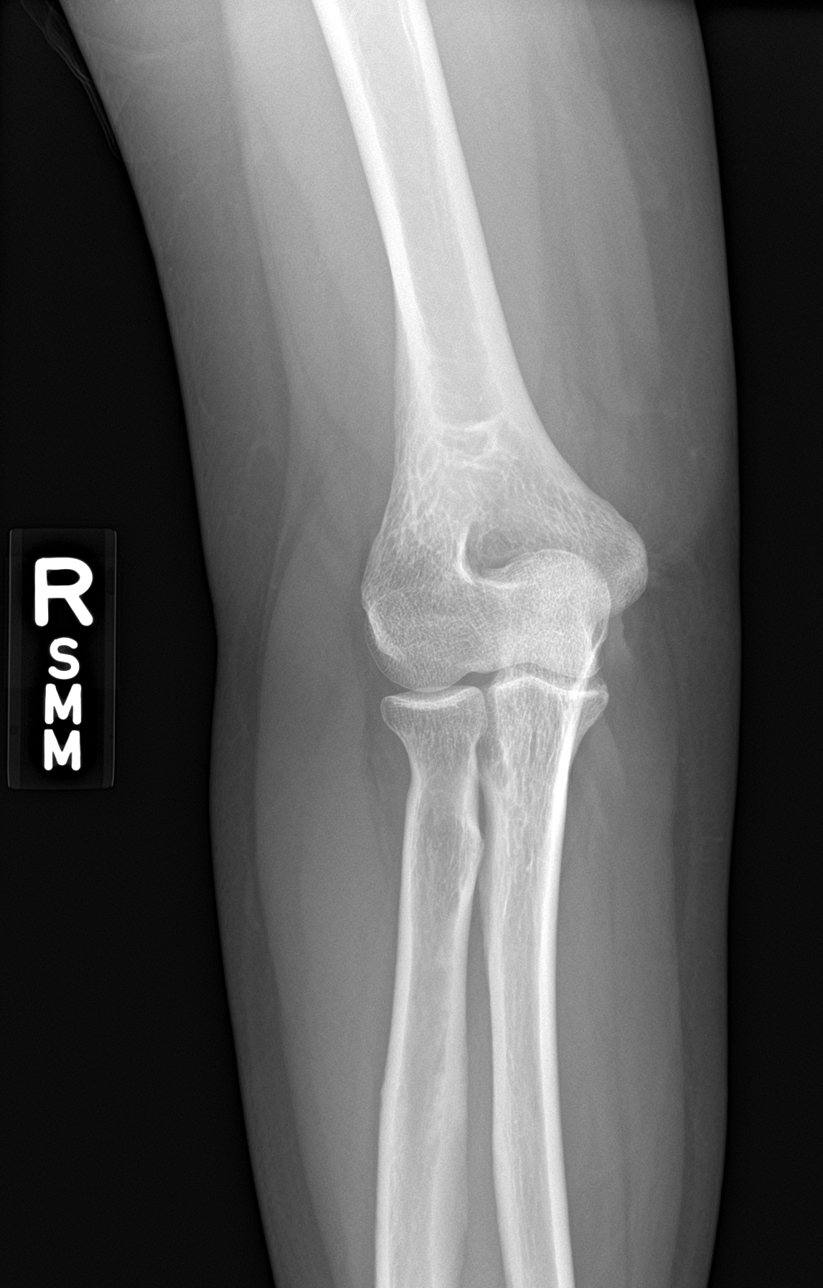

[elbow obl (2 of 2)]
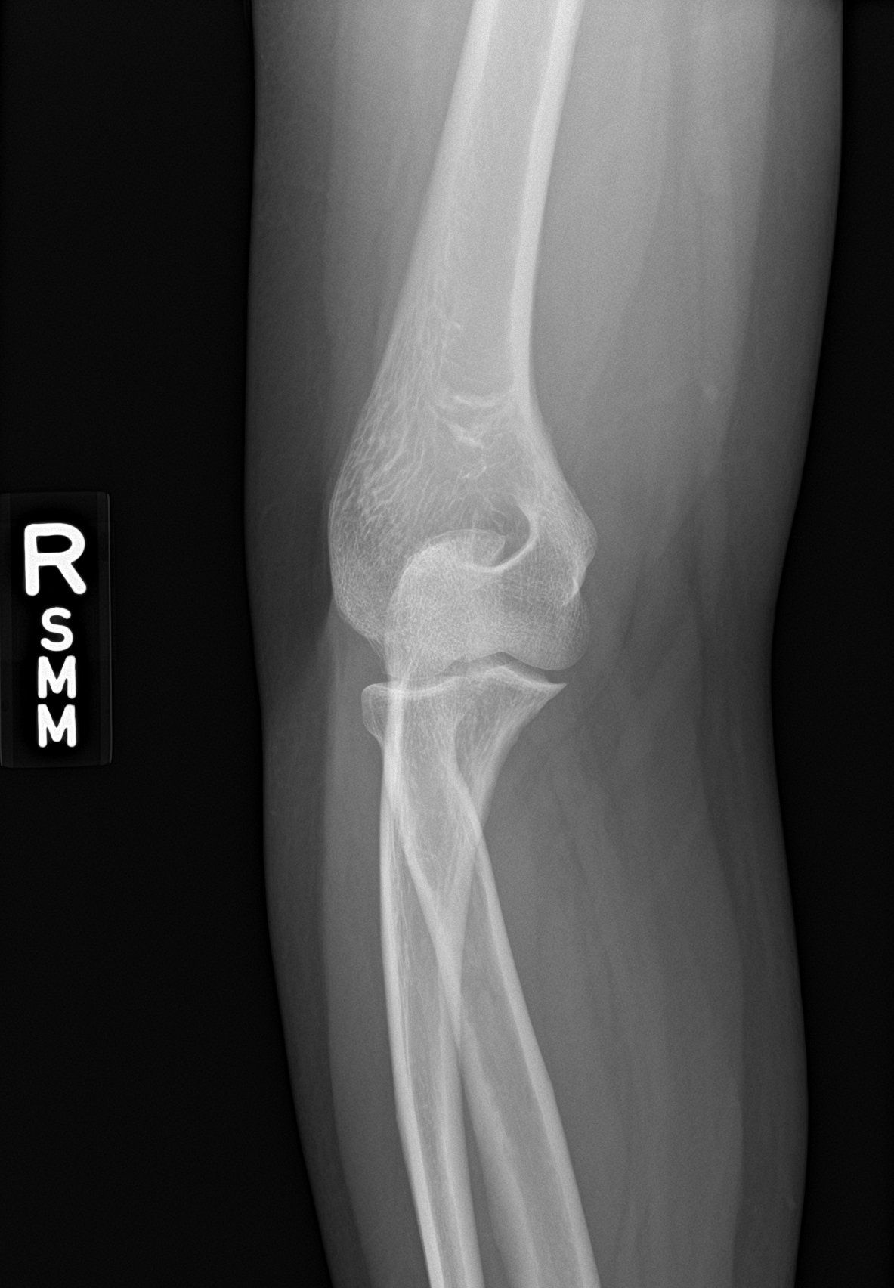

[elbow lat]
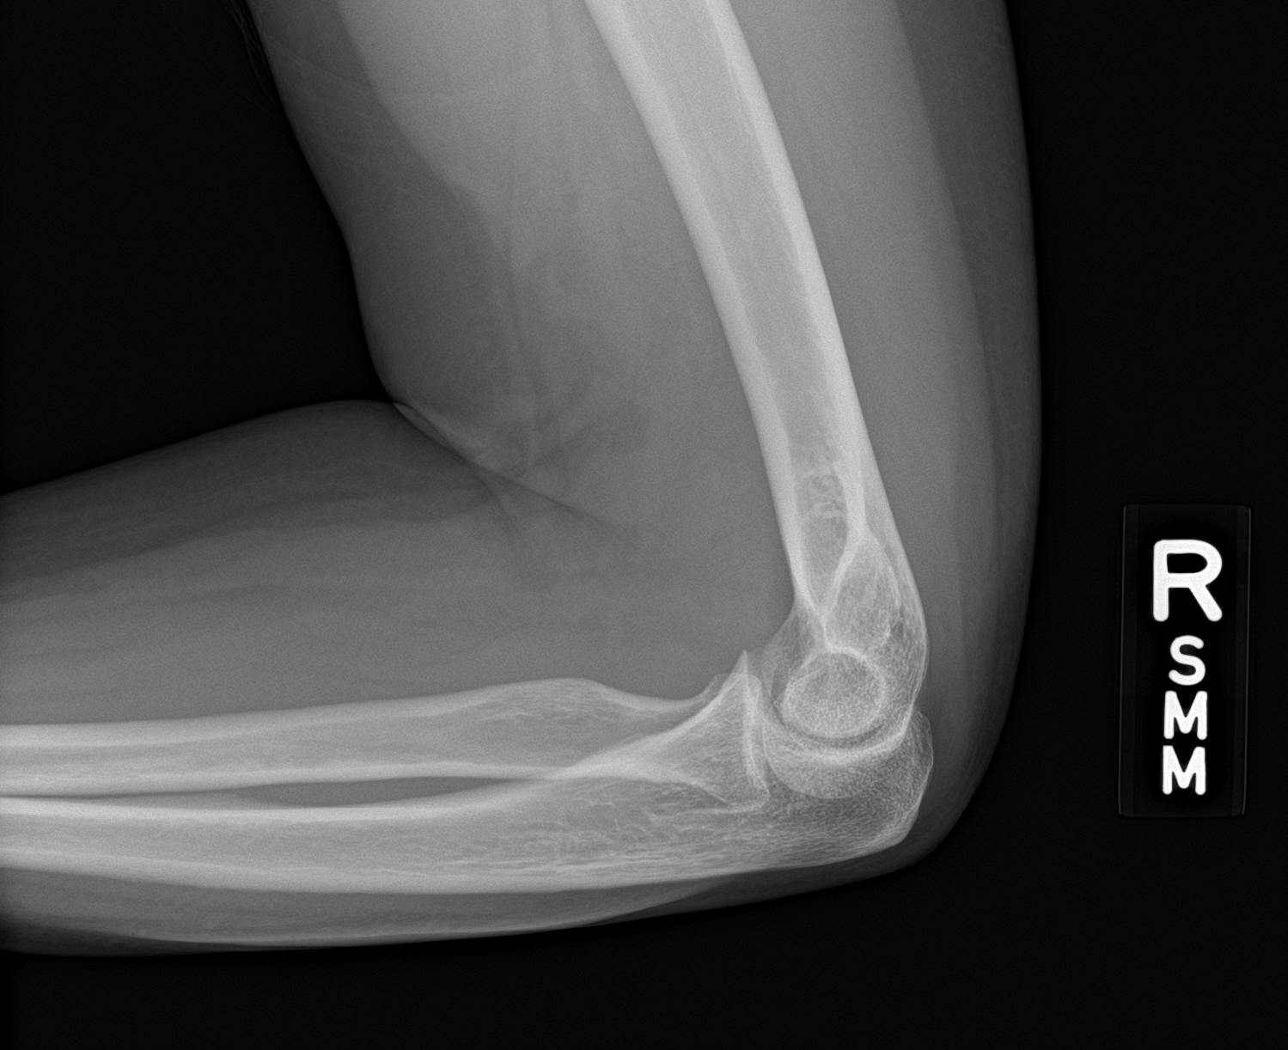

[4 of 4 positions shown; findings below may reference images not displayed]

FINDINGS: No acute bony or joint abnormality identified. No evidence of
fracture or dislocation.
IMPRESSION: No acute abnormality identified.

## 2022-07-31 ENCOUNTER — Encounter: Payer: No Typology Code available for payment source | Attending: Internal Medicine | Admitting: Dietician

## 2022-09-04 ENCOUNTER — Encounter: Payer: No Typology Code available for payment source | Attending: Internal Medicine | Admitting: Dietician

## 2022-09-04 ENCOUNTER — Encounter: Payer: Self-pay | Admitting: Dietician

## 2022-09-04 DIAGNOSIS — E669 Obesity, unspecified: Secondary | ICD-10-CM | POA: Insufficient documentation

## 2022-09-04 NOTE — Patient Instructions (Addendum)
Goal: aim to go to the gym 3-5 days per week (boxing class, weights, cardio).   Goal: aim to eat at least 3 times a day (even if its something small - carb+protein)  Aim to eat within 1-2 hours of waking up and every 3-5 hours following.   When snacking, aim to include a complex carb and protein.  At meals, aim to include 1/2 plate non-starchy vegetables, 1/4 plate protein, and 1/4 plate complex carbs.

## 2022-09-04 NOTE — Progress Notes (Signed)
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   Pt states she was vomiting 5 times a day from stress for a lot of last year and the beginning of this year and she thinks it was due to her job. Pt states she just was fired from her job 2 weeks ago and already notices a difference.    Pt states she was also having diarrhea and couldn't hold food down so she was drinking a lot of water.   Pt states she is trying to do an antiinflammatory diet.   Pt states she used to be physically active but has been unable to do that recently but is planning to start back. Pt states she likes boxing and weight lifting.   Pt states she was vitamin D deficient.   Pt states when she was working she wouldn't eat most of the day just drink water.  Estimated daily fluid intake: 128 oz Supplements: MVI Sleep: moderate stress Stress / self-care: moderate stress, just lost job Current average weekly physical activity: ADLs  24-Hr Dietary Recall First Meal: 1/2 protein shake (OWYN) Snack: none Second Meal: 1/2 protein shake and apple OR jerky Snack: 2:30pm: 3 bites of food (chicken breast, broccoli, sweet potato) Third Meal: a few bites of dinner.  Snack: none Beverages: 128 oz water   NUTRITION DIAGNOSIS  NB-1.1 Food and nutrition-related knowledge deficit As related to lack of prior nutrition  education by a nutrition professional.  As evidenced by pt report.   NUTRITION INTERVENTION  Nutrition education (E-1) on the following topics:  Fruits & Vegetables: Aim to fill half your plate with a variety of fruits and vegetables. They are rich in vitamins, minerals, and fiber, and can help reduce the risk of chronic diseases. Choose a colorful assortment of fruits and vegetables to ensure you get a wide range of nutrients. Grains and Starches: Make at least half of your grain choices whole grains, such as brown rice, whole wheat bread, and oats. Whole grains provide fiber, which aids in digestion and healthy cholesterol levels. Aim for whole forms of starchy vegetables such as potatoes, sweet potatoes, beans, peas, and corn, which are fiber rich and provide many vitamins and minerals.  Protein: Incorporate lean sources of protein, such as poultry, fish, beans, nuts, and seeds, into your meals. Protein is essential for building and repairing tissues, staying full, balancing blood sugar, as well as supporting immune function. Dairy: Include low-fat or fat-free dairy products like milk, yogurt, and cheese in your diet. Dairy foods are excellent sources of calcium and vitamin D, which are crucial for bone health.  Physical Activity: Aim for 60 minutes of physical activity daily. Regular physical activity promotes overall health-including helping to reduce risk for heart disease and diabetes, promoting mental health, and helping [place] sleep better.  Low Appetite: To manage low appetite and nausea through nutrition therapy, focus on small, frequent meals and snacks every 2-3 hours, incorporating nutrient-dense foods  that are easy to digest and mild in flavor. Cold or room-temperature foods and drinking fluids between meals can help minimize nausea. Ginger and peppermint may soothe nausea, while avoiding strong odors and sitting up after eating can prevent discomfort. Enhance appetite with appealing foods,  small portions, and meal replacement shakes for added calories and nutrients. Prioritize proteins, healthy fats, and complex carbohydrates, and consider a meal plan with balanced options like smoothies, soups, and simple snacks. Mindful eating and stress management techniques can further support digestion and reduce nausea.   Handouts Provided Include  Plate Method Snack Sheet  Learning Style & Readiness for Change Teaching method utilized: Visual & Auditory  Demonstrated degree of understanding via: Teach Back  Barriers to learning/adherence to lifestyle change: none  Goals Established by Pt  Goal: aim to go to the gym 3-5 days per week (boxing class, weights, cardio).   Goal: aim to eat at least 3 times a day (even if its something small - carb+protein)  Aim to eat within 1-2 hours of waking up and every 3-5 hours following.   When snacking, aim to include a complex carb and protein.  At meals, aim to include 1/2 plate non-starchy vegetables, 1/4 plate protein, and 1/4 plate complex carbs.    MONITORING & EVALUATION Dietary intake, weekly physical activity, and follow up in 2 months.  Next Steps  Patient is to call for questions.

## 2022-09-04 NOTE — Progress Notes (Signed)
Medical Nutrition Therapy    Referral diagnosis: Obesity, unspecified  Pt arrived late for appointment; no time for visit; patient seen by another provider

## 2022-11-05 ENCOUNTER — Encounter: Payer: Self-pay | Admitting: Dietician

## 2022-11-05 ENCOUNTER — Encounter: Payer: No Typology Code available for payment source | Admitting: Dietician

## 2022-11-05 DIAGNOSIS — Z713 Dietary counseling and surveillance: Secondary | ICD-10-CM | POA: Insufficient documentation

## 2022-11-05 DIAGNOSIS — E669 Obesity, unspecified: Secondary | ICD-10-CM | POA: Insufficient documentation

## 2022-11-05 NOTE — Progress Notes (Signed)
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 She states once she lost her job it took about a month and a half for her body to reset. She states she started feeling less stress and sleeping better. She states she then had some family drama that caused her stress.   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 She states she thinks this would be good because of the structure, not having to cook, access to a gym, etc.   Pt states she wasn't eating gluten and dairy and thinks it helps.  Pt states when she nauseated she craves carbohydrate foods and they seem to settle easiest on her stomach. She states on some days when she is more anxious she skips meals.   Pt states on average she may have 1 meal per day and 2 snacks.   Pt reports she is starting wegovy this week.   Pt states she has been walking but hasn't started boxing yet.   Pt reports she recently had a doctors visit and she was at her highest weight and it concerned her.   Estimated daily fluid intake: 128 oz Supplements: MVI Sleep: moderate stress Stress / self-care: moderate stress Current average weekly physical activity: ADLs  24-Hr Dietary Recall First Meal: pho broth with gluten free ramen, peas, and grilled chicken Snack: none Second Meal: babybell  cheese  Snack: none Third Meal: a few bites of dinner (meatloaf with vegetables) Snack: none Beverages: 128 oz water   NUTRITION DIAGNOSIS  NB-1.1 Food and nutrition-related knowledge deficit As related to lack of prior nutrition education by a nutrition professional.  As evidenced by pt report.   NUTRITION INTERVENTION  Nutrition education (E-1) on the following topics:  Fruits & Vegetables: Aim to fill half your plate with a variety of fruits and vegetables. They are rich in vitamins, minerals, and fiber, and can help reduce the risk of chronic diseases. Choose a colorful assortment of fruits and vegetables to ensure you get a wide range of nutrients. Grains and Starches: Make at least half of your grain choices whole grains, such as brown rice, whole wheat bread, and oats. Whole grains provide fiber, which aids in digestion and healthy cholesterol levels. Aim for whole forms of starchy vegetables such as potatoes, sweet potatoes, beans, peas, and corn, which are fiber rich and provide many vitamins and minerals.  Protein: Incorporate lean sources of protein, such as poultry, fish, beans, nuts, and seeds, into your meals. Protein is essential for building and repairing tissues, staying full, balancing blood sugar, as well as supporting immune function. Dairy: Include low-fat or fat-free dairy products like milk, yogurt, and cheese in your diet. Dairy foods are excellent sources of calcium and vitamin D, which are crucial for bone health.  Physical Activity: Aim for 60 minutes of physical activity daily. Regular physical activity promotes overall health-including helping to reduce risk for  heart disease and diabetes, promoting mental health, and helping [place] sleep better.  Low Appetite: To manage low appetite and nausea through nutrition therapy, focus on small, frequent meals and snacks every 2-3 hours, incorporating nutrient-dense foods that are easy to digest and mild in flavor. Cold or  room-temperature foods and drinking fluids between meals can help minimize nausea. Ginger and peppermint may soothe nausea, while avoiding strong odors and sitting up after eating can prevent discomfort. Enhance appetite with appealing foods, small portions, and meal replacement shakes for added calories and nutrients. Prioritize proteins, healthy fats, and complex carbohydrates, and consider a meal plan with balanced options like smoothies, soups, and simple snacks. Mindful eating and stress management techniques can further support digestion and reduce nausea.   Handouts Provided Include (initial visit) Plate Method Snack Sheet  Learning Style & Readiness for Change Teaching method utilized: Visual & Auditory  Demonstrated degree of understanding via: Teach Back  Barriers to learning/adherence to lifestyle change: none  Goals Established by Pt  New Goal:  -Go boxing tomorrow!!:)  Previous Goals: Aim to eat at least 3 times a day (even if its something small - carb+protein snack)  Aim to eat within 1-2 hours of waking up and every 3-5 hours following.   When snacking, aim to include a complex carb and protein.  At meals, aim to include 1/2 plate non-starchy vegetables, 1/4 plate protein, and 1/4 plate complex carbs.    MONITORING & EVALUATION Dietary intake, weekly physical activity, and follow up PRN (pt to call for follow up).  Next Steps  Patient is to call for questions.

## 2022-11-05 NOTE — Patient Instructions (Signed)
New Goal:  -Go boxing tomorrow!!:)  Previous Goals: Aim to eat at least 3 times a day (even if its something small - carb+protein snack)  Aim to eat within 1-2 hours of waking up and every 3-5 hours following.   When snacking, aim to include a complex carb and protein.  At meals, aim to include 1/2 plate non-starchy vegetables, 1/4 plate protein, and 1/4 plate complex carbs.

## 2023-05-03 ENCOUNTER — Ambulatory Visit
Admission: EM | Admit: 2023-05-03 | Discharge: 2023-05-03 | Disposition: A | Discharge status: Home / Self Care | Attending: Family Medicine | Admitting: Family Medicine

## 2023-05-03 ENCOUNTER — Other Ambulatory Visit: Admitting: Radiology

## 2023-05-03 ENCOUNTER — Other Ambulatory Visit: Payer: Self-pay

## 2023-05-03 ENCOUNTER — Ambulatory Visit (INDEPENDENT_AMBULATORY_CARE_PROVIDER_SITE_OTHER): Admitting: Radiology

## 2023-05-03 DIAGNOSIS — S6991XA Unspecified injury of right wrist, hand and finger(s), initial encounter: Secondary | ICD-10-CM

## 2023-05-03 DIAGNOSIS — S61239A Puncture wound without foreign body of unspecified finger without damage to nail, initial encounter: Secondary | ICD-10-CM

## 2023-05-03 DIAGNOSIS — Z23 Encounter for immunization: Secondary | ICD-10-CM | POA: Diagnosis not present

## 2023-05-03 MED ORDER — TETANUS-DIPHTH-ACELL PERTUSSIS 5-2.5-18.5 LF-MCG/0.5 IM SUSY
0.5000 mL | PREFILLED_SYRINGE | Freq: Once | INTRAMUSCULAR | Status: AC
  Administered 2023-05-03: 0.5 mL via INTRAMUSCULAR

## 2023-05-03 NOTE — ED Triage Notes (Addendum)
 Pt presents with complaints of right thumb injury. Pt states she works at [employer] and a piece of glass went into the right hand (below the thumb) just before midnight. Alcohol applied + Band-Aids applied. Pt currently rates her overall pain a 1/10. Pt states the area is more swollen, red, and inflamed today. Unsure if tetanus is up-to-date.

## 2023-05-03 NOTE — ED Provider Notes (Signed)
 [facility]    CSN: [id number] Arrival date & time: 05/03/23  1524      History   Chief Complaint Chief Complaint  Patient presents with   Finger Injury    HPI Connie Long is a 43 y.o. female.   HPI Right hand injury around midnight last night.  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  Her last tetanus shot was in 2019 per chart review.  She is here today because she is having pain and swelling in the hand that worsens with any gripping motions.  He is right-handed.  Denies redness, drainage, fever, red streaks, paresthesias Past Medical History:  Diagnosis Date   Pneumonia     There are no active problems to display for this patient.   History reviewed. No pertinent surgical history.  OB History   No obstetric history on file.      Home Medications    Prior to Admission medications   Medication Sig Start Date End Date Taking? Authorizing Provider  omeprazole (PRILOSEC) 40 MG capsule Take by mouth. 06/05/22  Yes [provider]  buPROPion (WELLBUTRIN XL) 150 MG 24 hr tablet Take 1 tablet by mouth daily. 02/14/20   [provider]  celecoxib (CELEBREX) 200 MG capsule Take 1 capsule (200 mg total) by mouth 2 (two) times daily. 04/20/21   Arthor Captain, PA-C  cetirizine (ZYRTEC) 10 MG tablet Take 10 mg by mouth daily.    [provider]  cyclobenzaprine (FLEXERIL) 10 MG tablet Take 10 mg by mouth at bedtime as needed. 07/28/19   [provider]  oxyCODONE (ROXICODONE) 5 MG immediate release tablet Take 0.5-1 tablets (2.5-5 mg total) by mouth every 6 (six) hours as needed for severe pain. 04/20/21   Harris, Cammy Copa, PA-C  predniSONE (DELTASONE) 10 MG tablet Use as directed per doctors orders. 09/08/19   Ralene Cork,  DO  sertraline (ZOLOFT) 50 MG tablet Take 50 mg by mouth daily.    [provider]    Family History Family History  Problem Relation Age of Onset   Healthy Mother    Healthy Father     Social History Social History   Tobacco Use   Smoking status: Some Days   Smokeless tobacco: Never  Vaping Use   Vaping status: Never Used  Substance Use Topics   Alcohol use: Yes    Alcohol/week: 4.0 standard drinks of alcohol    Types: 4 Standard drinks or equivalent per week   Drug use: Not Currently     Allergies   Patient has no known allergies.   Review of Systems Review of Systems   Physical Exam Triage Vital Signs ED Triage Vitals  Encounter Vitals Group     BP 05/03/23 1538 112/76     Systolic BP Percentile --      Diastolic BP Percentile --      Pulse Rate 05/03/23 1538 86     Resp 05/03/23 1538 18     Temp 05/03/23 1538 98.1 F (36.7 C)     Temp Source 05/03/23 1538 Oral     SpO2 05/03/23 1538 94 %     Weight 05/03/23 1535 200 lb 9.6 oz (91 kg)     Height 05/03/23 1535 5\' 3"  (1.6 m)  Head Circumference --      Peak Flow --      Pain Score 05/03/23 1535 1     Pain Loc --      Pain Education --      Exclude from Growth Chart --    No data found.  Updated Vital Signs BP 112/76 (BP Location: Right Arm)   Pulse 86   Temp 98.1 F (36.7 C) (Oral)   Resp 18   Ht 5\' 3"  (1.6 m)   Wt 200 lb 9.6 oz (91 kg)   LMP 04/08/2023 (Exact Date)   SpO2 94%   BMI 35.53 kg/m   Visual Acuity Right Eye Distance:   Left Eye Distance:   Bilateral Distance:    Right Eye Near:   Left Eye Near:    Bilateral Near:     Physical Exam Vitals reviewed.  HENT:     Head: Normocephalic.  Eyes:     Conjunctiva/sclera: Conjunctivae normal.  Cardiovascular:     Rate and Rhythm: Normal rate.  Pulmonary:     Effort: Pulmonary effort is normal. No respiratory distress.  Musculoskeletal:        General: No signs of injury.     Comments: Right hand tender near wound  see skin exam, right thumb with full range of motion no weakness capillary refill brisk, able to move all digits  Skin:    Comments: 2 mm wound right hand palmar surface thenar eminence, no redness, no drainage, no bleeding, no foreign body seen or palpated      UC Treatments / Results  Labs (all labs ordered are listed, but only abnormal results are displayed) Labs Reviewed - No data to display  EKG   Radiology No results found.  Procedures Procedures (including critical care time)  Medications Ordered in UC Medications - No data to display  Initial Impression / Assessment and Plan / UC Course  I have reviewed the triage vital signs and the nursing notes.  Pertinent labs & imaging results that were available during my care of the patient were reviewed by me and considered in my medical decision making (see chart for details).     Right-hand-dominant 43 year old presents with right hand pain after getting a piece of glass in her hand at work at midnight.  She was able to remove the glass, now has increased pain and local swelling.  Right hand has tiny wound with local tenderness no redness no drainage, thumb with full range of motion.  Right hand x-ray independently viewed by me no foreign body no fracture no free air Recommend elevation, daily wound care, NSAIDs, follow-up as needed Final Clinical Impressions(s) / UC Diagnoses   Final diagnoses:  None   Discharge Instructions   None    ED Prescriptions   None    PDMP not reviewed this encounter.   Meliton Rattan, [place] 05/03/23 5784

## 2023-05-03 NOTE — Discharge Instructions (Signed)
 Over the counter NSAIDs for pain: Ibuprofen 400 mg ( Ibuprofen, Advil or Motrin, 2 tablets) and Acetaminophen 1 gram (3 of the regular strength or 2 extra strength tablets) 3-4 times a day,  take on an empty stomach before each meal and bedtime (every 6 hours) for a few days Do not take if you are pregnant/breastfeeding. Allergic to NSAIDs have a history of ulcers, intestinal bleeding or liver or kidney disease or have taken an opioid.   The x-ray reading we discussed is preliminary. Your x-ray will be read by a radiologist in next few hours. If there is a discrepancy, you will be contacted, and instructed on a new plan for you care.

## 2023-11-26 ENCOUNTER — Other Ambulatory Visit: Payer: Self-pay

## 2023-11-26 ENCOUNTER — Emergency Department (HOSPITAL_COMMUNITY)

## 2023-11-26 ENCOUNTER — Emergency Department (HOSPITAL_COMMUNITY)
Admission: EM | Admit: 2023-11-26 | Discharge: 2023-11-26 | Disposition: A | Discharge status: Home / Self Care | Attending: Emergency Medicine | Admitting: Emergency Medicine

## 2023-11-26 DIAGNOSIS — R748 Abnormal levels of other serum enzymes: Secondary | ICD-10-CM

## 2023-11-26 DIAGNOSIS — R7401 Elevation of levels of liver transaminase levels: Secondary | ICD-10-CM | POA: Insufficient documentation

## 2023-11-26 DIAGNOSIS — R109 Unspecified abdominal pain: Secondary | ICD-10-CM | POA: Diagnosis present

## 2023-11-26 DIAGNOSIS — R1084 Generalized abdominal pain: Secondary | ICD-10-CM | POA: Insufficient documentation

## 2023-11-26 LAB — URINALYSIS, ROUTINE W REFLEX MICROSCOPIC
Bilirubin Urine: NEGATIVE
Glucose, UA: NEGATIVE mg/dL
Hgb urine dipstick: NEGATIVE
Ketones, ur: NEGATIVE mg/dL
Leukocytes,Ua: NEGATIVE
Nitrite: NEGATIVE
Protein, ur: NEGATIVE mg/dL
Specific Gravity, Urine: 1.018 (ref 1.005–1.030)
pH: 5 (ref 5.0–8.0)

## 2023-11-26 LAB — COMPREHENSIVE METABOLIC PANEL WITH GFR
ALT: 137 U/L — ABNORMAL HIGH (ref 0–44)
AST: 94 U/L — ABNORMAL HIGH (ref 15–41)
Albumin: 4.5 g/dL (ref 3.5–5.0)
Alkaline Phosphatase: 73 U/L (ref 38–126)
Anion gap: 12 (ref 5–15)
BUN: 9 mg/dL (ref 6–20)
CO2: 25 mmol/L (ref 22–32)
Calcium: 9.4 mg/dL (ref 8.9–10.3)
Chloride: 103 mmol/L (ref 98–111)
Creatinine, Ser: 0.71 mg/dL (ref 0.44–1.00)
GFR, Estimated: >60 mL/min (ref >60)
Glucose, Bld: 89 mg/dL (ref 70–99)
Potassium: 3.9 mmol/L (ref 3.5–5.1)
Sodium: 139 mmol/L (ref 135–145)
Total Bilirubin: 0.3 mg/dL (ref 0.0–1.2)
Total Protein: 7.6 g/dL (ref 6.5–8.1)

## 2023-11-26 LAB — CBC
HCT: 42.8 % (ref 36.0–46.0)
Hemoglobin: 13.5 g/dL (ref 12.0–15.0)
MCH: 31.5 pg (ref 26.0–34.0)
MCHC: 31.5 g/dL (ref 30.0–36.0)
MCV: 99.8 fL (ref 80.0–100.0)
Platelets: 321 K/uL (ref 150–400)
RBC: 4.29 MIL/uL (ref 3.87–5.11)
RDW: 13.1 % (ref 11.5–15.5)
WBC: 10.4 K/uL (ref 4.0–10.5)
nRBC: 0 % (ref 0.0–0.2)

## 2023-11-26 LAB — LIPASE, BLOOD: Lipase: 49 U/L (ref 11–51)

## 2023-11-26 LAB — HCG, SERUM, QUALITATIVE: Preg, Serum: NEGATIVE

## 2023-11-26 MED ORDER — IOHEXOL 300 MG/ML  SOLN
100.0000 mL | Freq: Once | INTRAMUSCULAR | Status: AC | PRN
  Administered 2023-11-26: 100 mL via INTRAVENOUS

## 2023-11-26 NOTE — ED Provider Triage Note (Signed)
 Emergency Medicine Provider Triage Evaluation Note  Connie Long , a 43 y.o. female  was evaluated in triage.  Pt complains of AP, N, V, D.  Review of Systems  Positive:  Negative:   Physical Exam  BP 115/85 (BP Location: Left Arm)   Pulse 89   Temp 98.1 F (36.7 C) (Oral)   Resp 16   Ht 5' 2.5 (1.588 m)   Wt 80.8 kg   SpO2 97%   BMI 32.06 kg/m  Gen:   Awake, no distress   Resp:  Normal effort  MSK:   Moves extremities without difficulty  Other:    Medical Decision Making  Medically screening exam initiated at 7:53 PM.  Appropriate orders placed.  Eshani Maestre was informed that the remainder of the evaluation will be completed by another provider, this initial triage assessment does not replace that evaluation, and the importance of remaining in the ED until their evaluation is complete.  Patient with intermittent nausea and vomiting since 10/7, today with increased, severe pain and explosive diarrhea. No fever. No bloody stool or hematemesis.   Has been followed by the [facility] for ongoing stomach issues. Seen by [facility] in July for EGD. Was seen at the [facility] today and recommended to come to ED for CT scan unavailable at the [facility] center.   Odell Balls, PA-C 11/26/23 1955

## 2023-11-26 NOTE — ED Triage Notes (Signed)
 Pt ambulatory to triage with complaints of nausea, vomiting, diarrhea x1 week. Yesterday pt reports a fever of 102F. PT reports taking Wegovy. Pt is unsure what is causing her symptoms.

## 2023-11-26 NOTE — ED Provider Notes (Signed)
 [place] EMERGENCY DEPARTMENT AT [facility] Provider Note   CSN: [id number] Arrival date & time: 11/26/23  1554     Patient presents with: Nausea and Emesis   Connie Long is a 43 y.o. female.   Patient is a 43 year old female who presents with abdominal pain associated with nausea and vomiting.  She says she has had some stomach issues for the last several years intermittently.  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  She said her symptoms seem to have improved although she started having a flareup again around October 7.  She describes intermittent crampiness associated with nausea and sometimes vomiting.  She does not report any recent diarrhea.  She said she had some worsening pain over the last couple days and was in bed most of the night.  However this morning she had a large bowel movement which she says was formed stool but it was just a large volume and after that she feels better.  Her abdominal pain has improved.  She does not have any ongoing vomiting.  She did have a fever of 102 yesterday but has not had any since then.  No urinary symptoms.  No cough or cold symptoms.       Prior to Admission medications   Medication Sig Start Date End Date Taking? Authorizing Provider  buPROPion (WELLBUTRIN XL) 150 MG 24 hr tablet Take 1 tablet by mouth daily. 02/14/20   [provider]  cetirizine (ZYRTEC) 10 MG tablet Take 10 mg by mouth daily.    [provider]  omeprazole (PRILOSEC) 40 MG capsule Take by mouth. 06/05/22   [provider]    Allergies: Patient has no known allergies.    Review of Systems  Constitutional:  Positive for fever. Negative for chills, diaphoresis and fatigue.  HENT:  Negative for congestion, rhinorrhea and sneezing.   Eyes: Negative.   Respiratory:  Negative for cough, chest tightness and  shortness of breath.   Cardiovascular:  Negative for chest pain and leg swelling.  Gastrointestinal:  Positive for abdominal pain, diarrhea, nausea and vomiting. Negative for blood in stool.  Genitourinary:  Negative for difficulty urinating, flank pain and frequency.  Musculoskeletal:  Negative for arthralgias and back pain.  Skin:  Negative for rash.  Neurological:  Negative for dizziness, speech difficulty, weakness, numbness and headaches.    Updated Vital Signs BP (!) 126/104   Pulse 83   Temp 98.5 F (36.9 C) (Oral)   Resp 16   Ht 5' 2.5 (1.588 m)   Wt 80.8 kg   SpO2 100%   BMI 32.06 kg/m   Physical Exam Constitutional:      Appearance: She is well-developed.  HENT:     Head: Normocephalic and atraumatic.  Eyes:     Pupils: Pupils are equal, round, and reactive to light.  Cardiovascular:     Rate and Rhythm: Normal rate and regular rhythm.     Heart sounds: Normal heart sounds.  Pulmonary:     Effort: Pulmonary effort is normal. No respiratory distress.     Breath sounds: Normal breath sounds. No wheezing or rales.  Chest:     Chest wall: No tenderness.  Abdominal:     General: Bowel sounds are normal.     Palpations: Abdomen is soft.     Tenderness: There is no abdominal tenderness. There is no guarding or rebound.  Musculoskeletal:  General: Normal range of motion.     Cervical back: Normal range of motion and neck supple.  Lymphadenopathy:     Cervical: No cervical adenopathy.  Skin:    General: Skin is warm and dry.     Findings: No rash.  Neurological:     Mental Status: She is alert and oriented to person, place, and time.     (all labs ordered are listed, but only abnormal results are displayed) Labs Reviewed  COMPREHENSIVE METABOLIC PANEL WITH GFR - Abnormal; Notable for the following components:      Result Value   AST 94 (*)    ALT 137 (*)    All other components within normal limits  LIPASE, BLOOD  CBC  URINALYSIS, ROUTINE W REFLEX  MICROSCOPIC  HCG, SERUM, QUALITATIVE    EKG: None  Radiology: CT ABDOMEN PELVIS W CONTRAST Result Date: 11/26/2023 EXAM: CT ABDOMEN AND PELVIS WITH CONTRAST 11/26/2023 11:05:42 PM TECHNIQUE: CT of the abdomen and pelvis was performed with the administration of 100 mL of iohexol (OMNIPAQUE) 300 MG/ML solution. Multiplanar reformatted images are provided for review. Automated exposure control, iterative reconstruction, and/or weight-based adjustment of the mA/kV was utilized to reduce the radiation dose to as low as reasonably achievable. COMPARISON: Right upper quadrant ultrasound 07/08/2023. CLINICAL HISTORY: Abdominal pain, acute, nonlocalized. Nausea, vomiting, diarrhea x1 week. Yesterday pt reports a fever of 102F. FINDINGS: LOWER CHEST: No acute abnormality. LIVER: There is diffuse fatty infiltration of the liver. GALLBLADDER AND BILE DUCTS: Gallbladder is unremarkable. No biliary ductal dilatation. SPLEEN: No acute abnormality. PANCREAS: No acute abnormality. ADRENAL GLANDS: No acute abnormality. KIDNEYS, URETERS AND BLADDER: No stones in the kidneys or ureters. No hydronephrosis. No perinephric or periureteral stranding. Urinary bladder is unremarkable. GI AND BOWEL: Stomach demonstrates no acute abnormality. The appendix appears normal. There is no bowel obstruction. PERITONEUM AND RETROPERITONEUM: No ascites. No free air. VASCULATURE: Aorta is normal in caliber. LYMPH NODES: No lymphadenopathy. REPRODUCTIVE ORGANS: No acute abnormality. BONES AND SOFT TISSUES: No acute osseous abnormality. No focal soft tissue abnormality. IMPRESSION: 1. No acute findings in the abdomen or pelvis. 2. Diffuse hepatic steatosis. Electronically signed by: Greig Pique MD 11/26/2023 11:13 PM EDT RP Workstation: [id number]     Procedures   Medications Ordered in the ED  iohexol (OMNIPAQUE) 300 MG/ML solution 100 mL (100 mLs Intravenous Contrast Given 11/26/23 2300)                                    Medical  Decision Making Amount and/or Complexity of Data Reviewed Labs: ordered.  Risk Prescription drug management.   This patient presents to the ED for concern of abdominal pain, nausea vomiting, this involves an extensive number of treatment options, and is a complaint that carries with it a high risk of complications and morbidity.  I considered the following differential and admission for this acute, potentially life threatening condition.  The differential diagnosis includes bowel obstruction, appendicitis, cholecystitis, colitis, diverticulitis, pancreatitis  MDM:    Patient is a 43 year old whose had some bowel issues for several years.  Was recently diagnosed with SIBO.  She completed antibiotics and she was actually doing better.  She started having a flareup again on [birth year] which was her birthday and at that time she did eat a little bit of bread and ate some dairy products which she typically does not do.  She is wondering if this flared  it up.  She has had some intermittent nausea and vomiting associated with abdominal cramping.  Her pain was worse since yesterday although after she had a large bowel movement, her symptoms resolved.  She does not have any abdominal tenderness on exam.  No ongoing vomiting.  No fever.  Labs reviewed and are nonconcerning other than her LFTs are mildly elevated.  She said they have been elevated in the past but I do not have any blood work in our system to compare.  She has an upcoming appointment in 2 weeks with her gastroenterologist.  I advised her that if her symptoms are still improved, she can follow-up in 2 weeks and will need to have her LFTs rechecked.  Otherwise she needs to follow-up sooner.  She was discharged home in good condition.  Return precautions were given.  (Labs, imaging, consults)  Labs: I Ordered, and personally interpreted labs.  The pertinent results include: Mildly elevated LFTs  Imaging Studies ordered: I ordered imaging studies  including CT abdomen pelvis I independently visualized and interpreted imaging. I agree with the radiologist interpretation  Additional history obtained from chart.  External records from outside source obtained and reviewed including prior notes  Cardiac Monitoring: The patient was maintained on a cardiac monitor.  If on the cardiac monitor, I personally viewed and interpreted the cardiac monitored which showed an underlying rhythm of: Sinus rhythm  Reevaluation: After the interventions noted above, I reevaluated the patient and found that they have :improved  Social Determinants of Health:    Disposition: Discharged to home  Co morbidities that complicate the patient evaluation  Past Medical History:  Diagnosis Date   Pneumonia      Medicines Meds ordered this encounter  Medications   iohexol (OMNIPAQUE) 300 MG/ML solution 100 mL    I have reviewed the patients home medicines and have made adjustments as needed  Problem List / ED Course: Problem List Items Addressed This Visit   None Visit Diagnoses       Generalized abdominal pain    -  Primary     Elevated liver enzymes                    Final diagnoses:  Generalized abdominal pain  Elevated liver enzymes    ED Discharge Orders     None          Lenor Hollering, MD 11/26/23 2333

## 2023-11-26 NOTE — Discharge Instructions (Signed)
 Follow-up with your gastroenterologist as scheduled.  Your liver enzymes were mildly elevated and need to be rechecked by your gastroenterologist.  Return to the emergency room if you have any worsening symptoms.

## 2023-12-15 ENCOUNTER — Emergency Department (HOSPITAL_COMMUNITY)
Admission: EM | Admit: 2023-12-15 | Discharge: 2023-12-15 | Disposition: A | Discharge status: Home / Self Care | Source: Ambulatory Visit | Attending: Emergency Medicine | Admitting: Emergency Medicine

## 2023-12-15 ENCOUNTER — Encounter (HOSPITAL_COMMUNITY): Payer: Self-pay

## 2023-12-15 ENCOUNTER — Other Ambulatory Visit: Payer: Self-pay

## 2023-12-15 DIAGNOSIS — R1110 Vomiting, unspecified: Secondary | ICD-10-CM | POA: Insufficient documentation

## 2023-12-15 DIAGNOSIS — T887XXA Unspecified adverse effect of drug or medicament, initial encounter: Secondary | ICD-10-CM

## 2023-12-15 NOTE — Discharge Instructions (Signed)
 Make sure that you keep up with your fluids and nutrition.  Use nutritional shakes as we discussed.  Follow-up with your doctor

## 2023-12-15 NOTE — ED Triage Notes (Signed)
 Pt to er, pt states that she has been having a lot of stress in her life, states that Friday she finally got a restraining order against her X and is starting to feel better. States that she is here because she has general fatigue and it is combination of relief and exhaustion.

## 2023-12-15 NOTE — ED Provider Notes (Signed)
 [place] EMERGENCY DEPARTMENT AT [facility] Provider Note   CSN: [id number] Arrival date & time: 12/15/23  1233     Patient presents with: Fatigue   Connie Long is a 43 y.o. female.   43 year old female presents after having vomiting which again this morning.  Patient states that she has chronic GI issues and takes Prilosec daily for it.  Patient also taken [facility] and is due for her shot tomorrow.  States that she has not had any diarrhea.  No fever or chills.  Her emesis was not bilious or bloody.  It has since resolved.  She is able to keep down liquids.  She has no abdominal pain.       Prior to Admission medications   Medication Sig Start Date End Date Taking? Authorizing Provider  buPROPion (WELLBUTRIN XL) 150 MG 24 hr tablet Take 1 tablet by mouth daily. 02/14/20   [provider]  cetirizine (ZYRTEC) 10 MG tablet Take 10 mg by mouth daily.    [provider]  omeprazole (PRILOSEC) 40 MG capsule Take by mouth. 06/05/22   [provider]    Allergies: Patient has no known allergies.    Review of Systems  All other systems reviewed and are negative.   Updated Vital Signs BP 112/82   Pulse 76   Temp 98 F (36.7 C) (Oral)   Resp 16   Ht 1.588 m (5' 2.5)   Wt 74.8 kg   SpO2 99%   BMI 29.70 kg/m   Physical Exam Vitals and nursing note reviewed.  Constitutional:      General: She is not in acute distress.    Appearance: Normal appearance. She is well-developed. She is not toxic-appearing.  HENT:     Head: Normocephalic and atraumatic.  Eyes:     General: Lids are normal.     Conjunctiva/sclera: Conjunctivae normal.     Pupils: Pupils are equal, round, and reactive to light.  Neck:     Thyroid: No thyroid mass.     Trachea: No tracheal deviation.  Cardiovascular:     Rate and Rhythm: Normal rate and regular rhythm.     Heart sounds: Normal heart sounds. No murmur heard.    No gallop.  Pulmonary:     Effort:  Pulmonary effort is normal. No respiratory distress.     Breath sounds: Normal breath sounds. No stridor. No decreased breath sounds, wheezing, rhonchi or rales.  Abdominal:     General: There is no distension.     Palpations: Abdomen is soft.     Tenderness: There is no abdominal tenderness. There is no rebound.  Musculoskeletal:        General: No tenderness. Normal range of motion.     Cervical back: Normal range of motion and neck supple.  Skin:    General: Skin is warm and dry.     Findings: No abrasion or rash.  Neurological:     Mental Status: She is alert and oriented to person, place, and time. Mental status is at baseline.     GCS: GCS eye subscore is 4. GCS verbal subscore is 5. GCS motor subscore is 6.     Cranial Nerves: No cranial nerve deficit.     Sensory: No sensory deficit.     Motor: Motor function is intact.  Psychiatric:        Attention and Perception: Attention normal.        Speech: Speech normal.  Behavior: Behavior normal.     (all labs ordered are listed, but only abnormal results are displayed) Labs Reviewed - No data to display  EKG: None  Radiology: No results found.   Procedures   Medications Ordered in the ED - No data to display                                  Medical Decision Making  Patient with likely side effects from taking [facility].  Her vital signs are stable as time.  She is able to keep down oral fluids.  Do not feel that she needs to have any IV fluids or blood work at this time.  Patient is agreeable.  Will follow-up with her doctor.     Final diagnoses:  None    ED Discharge Orders     None          Dasie Faden, MD 12/15/23 1451
